# Patient Record
Sex: Female | Born: 1965 | Race: White | Hispanic: No | Marital: Single | Smoking: Never smoker
Health system: Southern US, Community
[De-identification: ages and names within clinical notes are randomized; demographics above are authoritative.]

## PROBLEM LIST (undated history)

## (undated) DIAGNOSIS — D649 Anemia, unspecified: Secondary | ICD-10-CM

## (undated) DIAGNOSIS — J45909 Unspecified asthma, uncomplicated: Secondary | ICD-10-CM

## (undated) DIAGNOSIS — M722 Plantar fascial fibromatosis: Secondary | ICD-10-CM

## (undated) DIAGNOSIS — G43909 Migraine, unspecified, not intractable, without status migrainosus: Secondary | ICD-10-CM

## (undated) DIAGNOSIS — M719 Bursopathy, unspecified: Secondary | ICD-10-CM

## (undated) DIAGNOSIS — M1990 Unspecified osteoarthritis, unspecified site: Secondary | ICD-10-CM

## (undated) DIAGNOSIS — K259 Gastric ulcer, unspecified as acute or chronic, without hemorrhage or perforation: Secondary | ICD-10-CM

## (undated) HISTORY — PX: TUBAL LIGATION: SHX77

## (undated) HISTORY — PX: CARPAL TUNNEL RELEASE: SHX101

---

## 2004-06-24 ENCOUNTER — Emergency Department: Payer: Self-pay | Admitting: Emergency Medicine

## 2004-07-02 ENCOUNTER — Emergency Department: Payer: Self-pay | Admitting: Internal Medicine

## 2004-07-27 ENCOUNTER — Ambulatory Visit: Payer: Self-pay | Admitting: Obstetrics and Gynecology

## 2006-07-22 ENCOUNTER — Ambulatory Visit: Payer: Self-pay

## 2009-01-06 ENCOUNTER — Ambulatory Visit: Payer: Self-pay | Admitting: Specialist

## 2009-01-09 ENCOUNTER — Ambulatory Visit: Payer: Self-pay | Admitting: Specialist

## 2011-04-01 ENCOUNTER — Ambulatory Visit: Payer: Self-pay | Admitting: Internal Medicine

## 2011-04-09 ENCOUNTER — Ambulatory Visit: Payer: Self-pay | Admitting: Internal Medicine

## 2011-04-12 ENCOUNTER — Ambulatory Visit: Payer: Self-pay | Admitting: Internal Medicine

## 2011-05-10 ENCOUNTER — Ambulatory Visit: Payer: Self-pay | Admitting: Internal Medicine

## 2011-07-08 ENCOUNTER — Ambulatory Visit: Payer: Self-pay | Admitting: Family Medicine

## 2011-07-12 ENCOUNTER — Inpatient Hospital Stay: Payer: Self-pay | Admitting: Internal Medicine

## 2011-07-12 LAB — URINALYSIS, COMPLETE
Blood: NEGATIVE
Leukocyte Esterase: NEGATIVE
Ph: 5 (ref 4.5–8.0)
Protein: NEGATIVE
Specific Gravity: 1.014 (ref 1.003–1.030)
Squamous Epithelial: 2
WBC UR: <1 /HPF (ref 0–5)

## 2011-07-12 LAB — COMPREHENSIVE METABOLIC PANEL
Albumin: 4 g/dL (ref 3.4–5.0)
Alkaline Phosphatase: 77 U/L (ref 50–136)
Chloride: 111 mmol/L — ABNORMAL HIGH (ref 98–107)
Co2: 22 mmol/L (ref 21–32)
Creatinine: 0.71 mg/dL (ref 0.60–1.30)
EGFR (Non-African Amer.): >60
Glucose: 97 mg/dL (ref 65–99)
Osmolality: 289 (ref 275–301)
Potassium: 4.4 mmol/L (ref 3.5–5.1)
SGOT(AST): 27 U/L (ref 15–37)
Sodium: 142 mmol/L (ref 136–145)
Total Protein: 7.1 g/dL (ref 6.4–8.2)

## 2011-07-12 LAB — CBC
HGB: 11.5 g/dL — ABNORMAL LOW (ref 12.0–16.0)
MCHC: 33.8 g/dL (ref 32.0–36.0)
Platelet: 164 10*3/uL (ref 150–440)
RDW: 14 % (ref 11.5–14.5)
WBC: 6.2 10*3/uL (ref 3.6–11.0)

## 2011-07-12 LAB — PROTIME-INR: INR: 1

## 2011-07-12 LAB — HEMOGLOBIN: HGB: 9.6 g/dL — ABNORMAL LOW (ref 12.0–16.0)

## 2011-07-13 LAB — CBC WITH DIFFERENTIAL/PLATELET
Basophil #: 0 10*3/uL (ref 0.0–0.1)
Basophil %: 0.3 %
Eosinophil #: 0.1 10*3/uL (ref 0.0–0.7)
Eosinophil %: 1.6 %
HCT: 26.1 % — ABNORMAL LOW (ref 35.0–47.0)
HGB: 8.8 g/dL — ABNORMAL LOW (ref 12.0–16.0)
Lymphocyte #: 1.5 10*3/uL (ref 1.0–3.6)
MCH: 32.6 pg (ref 26.0–34.0)
MCHC: 33.8 g/dL (ref 32.0–36.0)
Neutrophil %: 60.2 %
WBC: 4.9 10*3/uL (ref 3.6–11.0)

## 2011-07-14 LAB — BASIC METABOLIC PANEL
Anion Gap: 8 (ref 7–16)
BUN: 16 mg/dL (ref 7–18)
Calcium, Total: 7.7 mg/dL — ABNORMAL LOW (ref 8.5–10.1)
Co2: 23 mmol/L (ref 21–32)
EGFR (African American): >60
Osmolality: 289 (ref 275–301)
Sodium: 145 mmol/L (ref 136–145)

## 2011-07-14 LAB — PROTIME-INR
INR: 1.1
Prothrombin Time: 14.5 secs (ref 11.5–14.7)

## 2011-07-14 LAB — CBC WITH DIFFERENTIAL/PLATELET
Basophil #: 0 10*3/uL (ref 0.0–0.1)
Basophil %: 0.2 %
Eosinophil #: 0 10*3/uL (ref 0.0–0.7)
Eosinophil %: 0.5 %
HCT: 24.3 % — ABNORMAL LOW (ref 35.0–47.0)
HGB: 8.3 g/dL — ABNORMAL LOW (ref 12.0–16.0)
Lymphocyte %: 23.7 %
MCV: 94 fL (ref 80–100)
Monocyte #: 0.4 x10 3/mm (ref 0.2–0.9)
Monocyte %: 6.5 %
Platelet: 95 10*3/uL — ABNORMAL LOW (ref 150–440)
RDW: 14.6 % — ABNORMAL HIGH (ref 11.5–14.5)
WBC: 6.7 10*3/uL (ref 3.6–11.0)

## 2011-07-14 LAB — HEMOGLOBIN: HGB: 8.4 g/dL — ABNORMAL LOW (ref 12.0–16.0)

## 2011-07-15 LAB — HEMOGLOBIN
HGB: 7.5 g/dL — ABNORMAL LOW (ref 12.0–16.0)
HGB: 8 g/dL — ABNORMAL LOW (ref 12.0–16.0)
HGB: 8.5 g/dL — ABNORMAL LOW (ref 12.0–16.0)
HGB: 8.9 g/dL — ABNORMAL LOW (ref 12.0–16.0)

## 2011-07-16 LAB — HEMOGLOBIN: HGB: 8.8 g/dL — ABNORMAL LOW (ref 12.0–16.0)

## 2011-07-17 LAB — CBC WITH DIFFERENTIAL/PLATELET
Basophil #: 0 10*3/uL (ref 0.0–0.1)
Basophil %: 0.3 %
Eosinophil %: 1.9 %
Lymphocyte #: 1.3 10*3/uL (ref 1.0–3.6)
MCH: 32.2 pg (ref 26.0–34.0)
MCV: 93 fL (ref 80–100)

## 2011-08-23 ENCOUNTER — Ambulatory Visit: Payer: Self-pay | Admitting: Unknown Physician Specialty

## 2012-01-30 ENCOUNTER — Emergency Department: Payer: Self-pay | Admitting: Emergency Medicine

## 2012-01-30 LAB — URINALYSIS, COMPLETE
Bacteria: NONE SEEN
Blood: NEGATIVE
Glucose,UR: NEGATIVE mg/dL (ref 0–75)
Ketone: NEGATIVE
Nitrite: NEGATIVE
Specific Gravity: 1.016 (ref 1.003–1.030)
Squamous Epithelial: 6
WBC UR: 4 /HPF (ref 0–5)

## 2012-01-30 LAB — COMPREHENSIVE METABOLIC PANEL
Albumin: 3.5 g/dL (ref 3.4–5.0)
Alkaline Phosphatase: 102 U/L (ref 50–136)
Anion Gap: 10 (ref 7–16)
BUN: 12 mg/dL (ref 7–18)
Bilirubin,Total: 0.6 mg/dL (ref 0.2–1.0)
Chloride: 107 mmol/L (ref 98–107)
Creatinine: 0.86 mg/dL (ref 0.60–1.30)
Glucose: 103 mg/dL — ABNORMAL HIGH (ref 65–99)
Osmolality: 272 (ref 275–301)
Potassium: 3.5 mmol/L (ref 3.5–5.1)
Sodium: 136 mmol/L (ref 136–145)
Total Protein: 7.3 g/dL (ref 6.4–8.2)

## 2012-01-30 LAB — CBC
HCT: 32 % — ABNORMAL LOW (ref 35.0–47.0)
HGB: 10.8 g/dL — ABNORMAL LOW (ref 12.0–16.0)
MCHC: 33.7 g/dL (ref 32.0–36.0)
MCV: 96 fL (ref 80–100)
RDW: 13.8 % (ref 11.5–14.5)
WBC: 10.3 10*3/uL (ref 3.6–11.0)

## 2012-01-30 LAB — RAPID INFLUENZA A&B ANTIGENS

## 2012-10-18 ENCOUNTER — Inpatient Hospital Stay: Payer: Self-pay | Admitting: Internal Medicine

## 2012-10-18 LAB — URINALYSIS, COMPLETE
Bilirubin,UR: NEGATIVE
Ketone: NEGATIVE
Leukocyte Esterase: NEGATIVE
Nitrite: NEGATIVE
RBC,UR: 1 /HPF (ref 0–5)
Squamous Epithelial: 7
WBC UR: <1 /HPF (ref 0–5)

## 2012-10-18 LAB — CBC
HCT: 35.2 % (ref 35.0–47.0)
HGB: 12.4 g/dL (ref 12.0–16.0)
MCH: 31.9 pg (ref 26.0–34.0)
MCHC: 35.2 g/dL (ref 32.0–36.0)
MCV: 91 fL (ref 80–100)
RBC: 3.89 10*6/uL (ref 3.80–5.20)
RDW: 14.2 % (ref 11.5–14.5)

## 2012-10-18 LAB — COMPREHENSIVE METABOLIC PANEL
Alkaline Phosphatase: 132 U/L (ref 50–136)
Anion Gap: 4 — ABNORMAL LOW (ref 7–16)
BUN: 11 mg/dL (ref 7–18)
Bilirubin,Total: 0.7 mg/dL (ref 0.2–1.0)
Calcium, Total: 9 mg/dL (ref 8.5–10.1)
Chloride: 107 mmol/L (ref 98–107)
Co2: 27 mmol/L (ref 21–32)
EGFR (African American): >60
EGFR (Non-African Amer.): >60
Osmolality: 275 (ref 275–301)
Potassium: 3.8 mmol/L (ref 3.5–5.1)
SGOT(AST): 34 U/L (ref 15–37)
SGPT (ALT): 38 U/L (ref 12–78)
Sodium: 138 mmol/L (ref 136–145)

## 2012-10-18 LAB — HEMOGLOBIN
HGB: 12.3 g/dL (ref 12.0–16.0)
HGB: 11.3 g/dL — ABNORMAL LOW (ref 12.0–16.0)

## 2012-10-18 LAB — PROTIME-INR: INR: 0.9

## 2012-10-19 LAB — CBC WITH DIFFERENTIAL/PLATELET
Basophil %: 0.4 %
HCT: 31.6 % — ABNORMAL LOW (ref 35.0–47.0)
HGB: 10.9 g/dL — ABNORMAL LOW (ref 12.0–16.0)
Lymphocyte #: 1.2 10*3/uL (ref 1.0–3.6)
Lymphocyte %: 32.4 %
Monocyte #: 0.5 x10 3/mm (ref 0.2–0.9)
Monocyte %: 13.8 %
Neutrophil %: 50.4 %
Platelet: 162 10*3/uL (ref 150–440)
RBC: 3.45 10*6/uL — ABNORMAL LOW (ref 3.80–5.20)
RDW: 14.4 % (ref 11.5–14.5)
WBC: 3.7 10*3/uL (ref 3.6–11.0)

## 2012-10-19 LAB — COMPREHENSIVE METABOLIC PANEL
Albumin: 3 g/dL — ABNORMAL LOW (ref 3.4–5.0)
Alkaline Phosphatase: 120 U/L (ref 50–136)
Anion Gap: 7 (ref 7–16)
Bilirubin,Total: 0.7 mg/dL (ref 0.2–1.0)
Chloride: 108 mmol/L — ABNORMAL HIGH (ref 98–107)
Glucose: 78 mg/dL (ref 65–99)
Osmolality: 279 (ref 275–301)
Potassium: 3.4 mmol/L — ABNORMAL LOW (ref 3.5–5.1)
SGPT (ALT): 32 U/L (ref 12–78)
Sodium: 141 mmol/L (ref 136–145)

## 2013-06-09 ENCOUNTER — Ambulatory Visit: Payer: Self-pay | Admitting: Orthopedic Surgery

## 2013-08-27 ENCOUNTER — Emergency Department: Payer: Self-pay | Admitting: Emergency Medicine

## 2014-04-16 ENCOUNTER — Emergency Department: Payer: Self-pay | Admitting: Emergency Medicine

## 2014-05-19 ENCOUNTER — Emergency Department: Payer: Self-pay | Admitting: Emergency Medicine

## 2014-07-01 NOTE — Consult Note (Signed)
CC: melena, falling hgb over night.  Due to this and previous duodenal ulcers she needs EGD today.  Dr,. Rein to do for me.  Patient aware of change in physicians.  Electronic Signatures: Scot JunElliott, Robert T (MD)  (Signed on 11-Aug-14 10:00)  Authored  Last Updated: 11-Aug-14 10:00 by Scot JunElliott, Robert T (MD)

## 2014-07-01 NOTE — Consult Note (Signed)
PATIENT NAMEEMMALINE, [name] MR#:  [id number] DATE OF BIRTH:  07/13/1965  DATE OF CONSULTATION:  10/18/2012  REFERRING PHYSICIAN:   CONSULTING PHYSICIAN:  Manya Silvas, MD  HISTORY OF PRESENT ILLNESS:  The patient is a 49 year old white female who was mowing her lawn and tripped and hurt her ankle. She took some ibuprofen on Thursday. She had melena on Saturday and came to the hospital today and was admitted to the hospital for melena and heme-positive stool. I was asked to see her in consultation.   The patient was in the hospital 07/12/2011 to 07/17/2011 last year with upper GI bleeding secondary to duodenal ulcer due to nonsteroidal anti-inflammatory drug use. She has chronic tension migraine headaches and is on Topamax and Imitrex for this.   ALLERGIES: AUGMENTIN, LODINE AND TRAMADOL.   MEDICATIONS: Imitrex injection p.r.n., Advair Diskus 1 puff once a day and was taking ibuprofen 800 mg 3 times a day.   SOCIAL HISTORY: The patient does not smoke, does not drink. Works as a Freight forwarder at [employer].   REVIEW OF SYSTEMS: She denies any vomiting. No fever. No dysuria. No hematuria. She does have frequent headaches. She has felt weak and nauseated and also took some Pepto-Bismol, which may have contributed to the darkness of her stools.   PHYSICAL EXAMINATION GENERAL: White female in no acute distress.  HEENT: Sclerae anicteric. Conjunctivae negative. Tongue negative.  HEAD: Atraumatic. Trachea is in the midline.  CHEST: Clear.  HEART: No murmurs, gallops, clicks or rubs that I can hear.  ABDOMEN: Abdomen shows minimal tenderness in epigastric, right upper quadrant areas. No hepatosplenomegaly.  PSYCHIATRIC: Mood and affect are appropriate. The patient is alert and oriented.   LABORATORY DATA: Glucose 93, BUN 11, creatinine 0.77, sodium 138, potassium 3.8, chloride 107, CO2 27, calcium 9, total protein 7.5, albumin 3.8, total bili 0.7, alk phos 132, SGOT 34, SGPT 38. WBC is  5.2, hemoglobin 12.4, repeat is 12.3, platelet count 173.  O positive blood with negative antibody screen. Pro time 12.5, INR 0.9. Hazy urine, 2+ blood.   ASSESSMENT: Upper gastrointestinal bleed from a flare-up of ulcer disease with ibuprofen because of musculoskeletal injury.   RECOMMENDATIONS:  I agree with treating with IV Protonix. Her hemoglobin appears to be stable, do not see an indication for transfusion and given her previous endoscopy showing duodenal ulcers I think we can go and treat her. I would recommend getting an H. pylori blood test in the morning with  the next blood draw.   ____________________________ Manya Silvas, MD rte:cs D: 10/18/2012 17:06:00 ET T: 10/18/2012 17:28:22 ET JOB#: [id number]  cc: Leonie Douglas. Doy Hutching, MD Manya Silvas, MD, <Dictator> Milinda Pointer. Jacqualine Code, MD    Manya Silvas MD ELECTRONICALLY SIGNED 11/08/2012 16:22

## 2014-07-01 NOTE — Consult Note (Signed)
CC: PUD, on EGD no active bleeding of ulcer or duodenitis.  Await H. pylori blood test.  She could go home on bid PPI, full liquid type diet.  Recommend vicodin for pain since cannot take NSAID and allergic to tramadol.  Follow up with Dr. Shelle Ironein in a week or two.    Electronic Signatures: Scot JunElliott, Robert T (MD)  (Signed on 12-Aug-14 07:30)  Authored  Last Updated: 12-Aug-14 07:30 by Scot JunElliott, Robert T (MD)

## 2014-07-01 NOTE — H&P (Signed)
PATIENT NAMESHELITA, [name] MR#:  [id number] DATE OF BIRTH:  23-Jun-1965  DATE OF ADMISSION:  10/18/2012  REFERRING PHYSICIAN:  Dr. Mindi Junker  FAMILY PHYSICIAN:  None.   REASON FOR ADMISSION:  Upper GI bleed.   HISTORY OF PRESENT ILLNESS: The patient is a 49 year old female with a history of duodenal ulcer, treated here approximately 15 months ago. Also has a history of asthma and migraines. Has no medical insurance, so has not seen a doctor in the recent past. She has been using ibuprofen for an ankle strain over the past several days. Yesterday, the patient developed melanotic stools associated with abdominal pain and nausea. No vomiting. Presented to the Emergency Room, where she was found to be hemodynamically stable, with a normal hemoglobin. She did, however, have black stools which were guaiac-positive, and she is now admitted for further evaluation.   PAST MEDICAL HISTORY: 1.  History of duodenal ulcer with upper GI bleed.  2.  Asthma.  3.  Migraine headaches.  4.  History of hemorrhoids.   MEDICATIONS:  None.   ALLERGIES:  (Dictation Anomaly)  TRAMADOL AND AUGMENTIN.   SOCIAL HISTORY:  Negative for alcohol or tobacco abuse.   FAMILY HISTORY: Positive for hypertension and coronary artery disease, but otherwise unremarkable.   REVIEW OF SYSTEMS:   CONSTITUTIONAL:  No fever or change in weight.  EYES:  No blurred or double vision. No glaucoma.  EARS, NOSE THROAT: No tinnitus or hearing loss. No nasal discharge or bleeding. No difficulty swallowing.  RESPIRATORY:  No cough or wheezing. No hemoptysis.  CARDIOVASCULAR:  No chest pain or orthopnea. No palpitations or syncope.  GASTROINTESTINAL:  No vomiting. Some diarrhea.  GENITOURINARY:  No dysuria, hematuria, or incontinence.  ENDOCRINE:  No polyuria or polydipsia. No heat or cold intolerance.  HEMATOLOGIC:  The patient denies anemia, easy bruising.  LYMPHATIC:  No swollen glands.  MUSCULOSKELETAL:  The patient has some back  pain, but denies neck, shoulder, knee, or hip pain. Some left ankle pain. No gout.  NEUROLOGIC:  No numbness. Some weakness. No recent migraines. Denies stroke or seizures.  PSYCHIATRIC:  The patient denies anxiety, insomnia, or depression.   PHYSICAL EXAMINATION: GENERAL:  The patient is in no acute distress.  VITAL SIGNS: Remarkable for a blood pressure of 119/63 with a heart rate of 64 and a respiratory rate of 16. She is afebrile.  HEENT: Normocephalic, atraumatic. Pupils equally round and reactive to light and accommodation. Extraocular movements are intact. Sclerae are nonicteric. Conjunctivae are clear. Oropharynx is clear.  NECK: Supple, without JVD or bruits. No adenopathy or thyromegaly was noted.  LUNGS:  Clear to auscultation and percussion without wheezes, rales, or rhonchi. No dullness. Respiratory effort is normal.  CARDIAC EXAM:  Regular rate and rhythm. Normal S1, S2. No significant rubs, murmurs, or gallops. PMI is nondisplaced. Chest wall is nontender.  ABDOMEN:  Soft, but diffusely tender. No rebound or guarding. Normoactive bowel sounds. No organomegaly or masses were appreciated. No hernias or bruits were noted.  RECTAL EXAM:  Revealed guaiac positive stool, per the Emergency Room physician.  EXTREMITIES: Without clubbing, cyanosis, or edema. Pulses were 2+ bilaterally.  SKIN:  Warm and dry, without rash or lesions.  NEUROLOGIC EXAM:  Revealed cranial nerves II through XII grossly intact. Deep tendon reflexes were symmetric. Motor and sensory exams nonfocal.  PSYCHIATRIC EXAM:  Revealed a patient who is alert and oriented to person, place and time. She was cooperative and used good judgment.   LABORATORY DATA:  Glucose was 93, with a BUN of 11, creatinine 0.77, sodium 138, with a potassium of 3.8. GFR was greater than 60. White count was 5.2, with a hemoglobin of 12.4. Pro time was 12.5, with an INR of 0.9. Urinalysis was unremarkable.   ASSESSMENT: 1.  Abdominal pain.  2.   Nausea.  3.  Diarrhea.  4.  Gastrointestinal bleed, presumably upper.  5.  Migraine headaches.  6.  Asthma.   PLAN:  The patient will be admitted to the floor on a clear liquid diet with IV fluids and a Protonix drip. Will follow her hemoglobin q. 6 hours. Will obtain a GI consult for presumed endoscopy. Will continue her Advair and resume Topamax for her headaches. Follow up routine labs in the morning. Further treatment and evaluation will depend upon the patient's progress.   Total time spent on this patient was 50 minutes.     ____________________________ Duane LopeJeffrey D. Judithann SheenSparks, MD jds:mr D: 10/18/2012 12:59:30 ET T: 10/18/2012 18:41:43 ET JOB#: [id number]  cc: Duane LopeJeffrey D. Judithann SheenSparks, MD, <Dictator> Luceal Hollibaugh Rodena Medin Apryle Stowell MD ELECTRONICALLY SIGNED 10/19/2012 7:52

## 2014-07-01 NOTE — Consult Note (Signed)
CC: melena and nausea.  Pt stable, hgb stable, will follow with you.  Since EGD done 15months ago will not repeat at this time unless bleeding kicks up.  Electronic Signatures: Scot JunElliott, Summar Mcglothlin T (MD)  (Signed on 10-Aug-14 17:01)  Authored  Last Updated: 10-Aug-14 17:01 by Scot JunElliott, Nyana Haren T (MD)

## 2014-07-01 NOTE — Discharge Summary (Signed)
PATIENT NAMRalph [name]:  Tortorelli, Danniella MR#:  [id number] DATE OF BIRTH:  1965/09/27  DATE OF ADMISSION:  10/18/2012 DATE OF DISCHARGE:  10/20/2012  DISCHARGE DIAGNOSES: 1.  Gastrointestinal bleed/acute post hemorrhagic anemia, likely due to bleeding from duodenal ulcer with underlying stress and increased intake of nonsteroidal anti-inflammatory due to left shoulder/ankle pain, now hemodynamically stable.  2.  Abdominal pain/nausea likely due to duodenal ulcer, now improving.   SECONDARY DIAGNOSES: 1.  History of asthma.  2.  Migraine headaches.  3.  Hemorrhoids.   CONSULTATIONS:  GI, Dr. Mechele CollinElliott.   PROCEDURES AND RADIOLOGY:  1.  EGD on 08/11 showed duodenitis  and ulcer the duodenum.  2.  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  3.  Major laboratory panel: Urinalysis on admission was negative.   HISTORY AND SHORT HOSPITAL COURSE:  The patient is a 49 year-old female with multiple medical problems who was admitted for GI bleed. Please seen Dr. Judithann SheenSparks dictated history and physical for further details. GI consultation was obtained with Dr. Mechele CollinElliott who recommended EGD which was performed by Dr. Dow AdolphMatthew Rein and showed 1 duodenal ulcer with clean base. Erythematous duodenopathy was also seen. The patient  was started on diet and tolerated fine. She is being discharged back to home in stable condition. She remained hemodynamically stable.   PHYSICAL EXAMINATION  VITAL SIGNS: On the date of discharge are as follows:  Temperature 98.6, heart rate 80 per minute, respirations 18 per minute,  blood pressure 114/77,  saturating  99% on room air.  CARDIOVASCULAR: S1, S2 normal. No murmurs, rubs or gallops.  LUNGS: Clear to auscultation bilaterally. No wheezing, rales, rhonchi or crepitation.  ABDOMEN: Soft, benign.  NEUROLOGIC:  Nonfocal examination.   All other physical examination remained at baseline.    DISCHARGE MEDICATIONS:  1.  Advair 250/50 one puff daily.  2.   Imitrex 0.5 mL  subcutaneous once daily as needed.  3.  Topamax 50 mg p.o. at bedtime.  4.  Protonix 40 mg p.o. b.i.d.  5.  Sucralfate 1 gram p.o. 3 times a day before meals. 6.  Vicodin 300/5 mg 1 tablet p.o. b.i.d. as needed.   DISCHARGE DIET: Low sodium. Eat light for the first meal. Begin with soft diet.   DISCHARGE ACTIVITY: As tolerated.   DISCHARGE INSTRUCTIONS AND FOLLOW-UP: The patient was instructed to follow-up with GI, Dr. Dow AdolphMatthew Rein, in 1 to 2 weeks. She will need follow-up with Dr. Wonda ChengJoel Moffett, in 2 to 4 weeks.   TOTAL TIME DISCHARGING THIS PATIENT: 35 minutes.     ____________________________ Ellamae SiaVipul S. Sherryll BurgerShah, MD vss:dp D: 10/20/2012 14:24:31 ET T: 10/20/2012 16:35:26 ET JOB#: [id number]  cc: Heba Ige S. Sherryll BurgerShah, MD, <Dictator> Scot Junobert T. Elliott, MD Durward MallardJoel B. Marguerite OleaMoffett, MD Dow AdolphMatthew Rein, MD  Ellamae SiaVIPUL S [facility] MD ELECTRONICALLY SIGNED 10/21/2012 22:34

## 2014-07-03 NOTE — Consult Note (Signed)
Brief Consult Note: Diagnosis: UGI from duodenal ulcer.   Patient was seen by consultant.   Consult note dictated.   Recommend further assessment or treatment.   Discussed with Attending MD.   Comments: At present if rebleeds due to difficult location of ulcer in D2 will need laparotomy and oversew of ulcer. agree with move to ICU, serial hct. We are available in hospital.  Electronic Signatures: Natale LayBird, Talani Brazee (MD)  (Signed [phone] [phone]-May-13 15:12)  Authored: Brief Consult Note   Last Updated: 04-May-13 15:12 by Natale LayBird, Rheya Minogue (MD)

## 2014-07-03 NOTE — Consult Note (Signed)
Actively oozing ulcer in beginning of second portion of the duodenum treated with epi 10cc and heater probe.  Initially increased bleeding but then after all treatments the bleeding was noted to stop.  Will move to CCU, crossmatch and transfuse and get surgical consult.  Dr. Egbert GaribaldiBird notified.  Electronic Signatures: Scot JunElliott, Finn Amos T (MD)  (Signed on 04-May-13 15:00)  Authored  Last Updated: 04-May-13 15:00 by Scot JunElliott, Willis Holquin T (MD)

## 2014-07-03 NOTE — Consult Note (Signed)
PATIENT NAMRalph [name]:  [name], [name] MR#:  [id number] DATE OF BIRTH:  [birth year]  DATE OF CONSULTATION:  07/13/2011  REFERRING PHYSICIAN:  Lynnae Prudeobert Elliott, MD    CONSULTING PHYSICIAN:  Redge GainerMark A. Egbert GaribaldiBird, MD  REASON FOR CONSULTATION: Duodenal bleeding, upper GI bleed and anemia.   HISTORY: The patient is a 49 year old white female with a history of migraine headaches, and a remote history of upper gastrointestinal bleeding and duodenal ulcer 20 years ago, who uses nonsteroidal anti-inflammatory drugs. The patient was admitted yesterday to the Medical Service with mild anemia, new onset, and new onset of dark bleeding per rectum. She was seen by Gastroenterology. She was found to have a low hemoglobin on repeat, continued melena, and elevated BUN. An upper endoscopy was recently performed, at which point Dr. Mechele CollinElliott personally contacted me regarding further interventions. A punctate active area of ooze was found in the second portion of the duodenum and in a fold of the mucosa which was difficult to cauterize and inject with epinephrine, but the bleeding appeared to have been controlled with endoscopy. Due to the concern of the location and possible re-bleeding, Surgical consult was obtained. Of note, the patient denies any history of abdominal pain, nausea, vomiting, diarrhea, constipation, straining, but tarry stools, difficulty swallowing, loss of appetite. She does have a history of normocytic anemia and fluctuating leukopenia for which she was seeing Dr. Sherrlyn HockPandit in Hematology, and no specific etiology was identified.   ALLERGIES: Etodolac, Augmentin, and tramadol.   CURRENT MEDICATIONS: Advair, Aleve, ibuprofen, Imitrex,  topiramate, vitamin D3.   FAMILY HISTORY: Family history is significant for peptic ulcer disease in her father. Mother with Alzheimer disease, diabetes, lung cancer, breast cancer and leukemia.   PAST MEDICAL/SURGICAL HISTORY: Significant for: 1. Migraines.  2. Asthma.   3. Hemorrhoids. 4. Carpal tunnel surgery. 5. Tubal ligation.   SOCIAL HISTORY: Rare alcohol. Nonsmoker. She works at [employer] and [place] in [place].  PHYSICAL EXAMINATION:  GENERAL: The patient is alert and oriented.   VITAL SIGNS: Temperature is 98.5, pulse 86, respiratory rate 18, blood pressure 107/72. The patient has recently been sedated with upper endoscopy.   ABDOMEN: Soft and nontender. Appropriate scars. Abdomen is soft and nontender. No obvious masses or hernias.   LUNGS: Clear.   HEART: Regular rate and rhythm.   NEUROLOGIC/PSYCHIATRIC: Examination is normal.   LABORATORY, DIAGNOSTIC AND RADIOLOGICAL DATA:  Type and screen was obtained.  BUN 30, creatinine 0.71, sodium 142, chloride 111.  Liver function tests are normal.  Admission hemoglobin 11.5 at 18:30, at 5:30 was 9.6, at 6:00 this morning was 8.8. Platelet count 126,000.  Pro time is 13.1, INR is 1.0.  Plain films of the chest, PA and lateral, demonstrate no acute changes.   IMPRESSION: Upper gastrointestinal bleed and anemia secondary to a duodenal ulcer which has been controlled by Dr. Earnest ConroyElliott's endoscopy, heater probe and epinephrine injection.   PLAN: At present no surgical intervention is required; however, if she does rebleed, in speaking with Dr. Mechele CollinElliott and looking at the images and from his note, it is clear that this was a difficult control of bleeding, and she may possibly require laparotomy with duodenotomy or pyloroplasty oversew of bleeding. I will follow along with you closely. Surgical Services will be available 24/7 this weekend in the hospital.   ____________________________ Redge GainerMark A. Egbert GaribaldiBird, MD mab:cbb D: 07/13/2011 15:20:08 ET T: 07/13/2011 15:47:28 ET JOB#: [id number] cc: Scot Junobert T. Elliott, MD Durward MallardJoel B. Marguerite OleaMoffett, MD Loraine LericheMARK Kela MillinA Janiah Devinney MD ELECTRONICALLY SIGNED 07/14/2011 17:25

## 2014-07-03 NOTE — Consult Note (Signed)
VSS afebrile, hgb was down some so she was given a unit of blood today.  Her color looks much better than on Saturday.  No abd pain. no nausea or vomiting.   Abd not tender, chest clear. Will try full liquids tomorrow and maybe home Wednesday on bid PPI.   Electronic Signatures: Scot JunElliott, Emili Mcloughlin T (MD)  (Signed on 06-May-13 17:17)  Authored  Last Updated: 06-May-13 17:17 by Scot JunElliott, Tikesha Mort T (MD)

## 2014-07-03 NOTE — H&P (Signed)
[name]:  [name], [name] MR#:  [id number] DATE OF BIRTH:  04-06-1965  DATE OF ADMISSION:  07/12/2011  PRIMARY CARE PHYSICIAN:  Dr. Wonda ChengJoel Moffett.   CHIEF COMPLAINT: Bright red blood per rectum today.   HISTORY OF PRESENT ILLNESS: [name] is a pleasant [age] year old Caucasian female with history of asthma and migraine headaches who comes to [name] after she started having episodes of maroon to bright red blood per rectum. She had about three episodes today at work, felt a little dizzy, came to [name], had another episode of rectal bleed and is now being admitted for further evaluation and management. [name] denies any abdominal pain, fever, nausea or vomiting. In [name] Emergency Room she was started on IV fluids. She is hemodynamically stable. She is being admitted for further evaluation.   PAST MEDICAL HISTORY:  1. Migraine headaches.  2. Asthma.  3. Hemorrhoids.   ALLERGIES: Etodolac, Augmentin and tramadol.   CURRENT MEDICATIONS:  1. Advair 250/50, one puff b.i.d.  2. Aleve 220 mg 2 tablets as needed for arm pain.  3. Ibuprofen 200 mg 3 tablets as needed for arm pain.  4. Imitrex 100 mg 1 tablet daily as needed for migraine.  5. Imitrex injection as needed for migraine prophylaxis.  6. Topiramate 50 mg 3 tablets.  7. Vitamin D3 2000 international units p.o. daily.   FAMILY HISTORY: Positive for mother with Alzheimer's dementia and type 2 diabetes.  Father died of lung cancer. There is history of breast cancer and leukemia on [name] father's side.   SOCIAL HISTORY: Nonsmoker. Works at [employer]. Occasionally drinks alcohol.   REVIEW OF SYSTEMS: CONSTITUTIONAL: No fever, fatigue or weakness. EYES: No blurred or double vision. ENT: No tinnitus, ear pain, or hearing loss. RESPIRATORY: No cough, wheeze, or hemoptysis. CARDIOVASCULAR: No chest pain, orthopnea, or edema. GASTROINTESTINAL: Positive for rectal bleed. No nausea, vomiting, diarrhea, or abdominal pain. GU:  No dysuria or hematuria. ENDOCRINE: No polyuria or nocturia. HEMATOLOGY: No anemia or easy bruising. SKIN: No acne or rash. MUSCULOSKELETAL: Positive for left shoulder pain. NEUROLOGIC: No cerebrovascular accident or transient ischemic attacks. PSYCH: No anxiety or depression. All other systems reviewed and negative.   PHYSICAL EXAMINATION:  GENERAL: [name] is awake, alert, and oriented x3, not in acute distress.   VITAL SIGNS: Afebrile, pulse 97, blood pressure 130/85, sats 100% on room air.   HEENT: Atraumatic, normocephalic. Pupils are equal, round, and reactive to light and accommodation. Extraocular movements intact. Oral mucosa is moist.   NECK: Supple. No jugular venous distention.  No carotid bruit.   LUNGS: Clear to auscultation bilaterally. No rales, rhonchi, respiratory distress, or labored breathing.   HEART: Both heart sounds are normal. Rhythm is regular. Rate is normal. No murmur heard. PMI is not lateralized. Chest is nontender.   EXTREMITIES: Good pedal pulses, good femoral pulses. No lower extremity edema.   ABDOMEN: Soft, benign, and nontender. No organomegaly. Positive bowel sounds.   NEUROLOGIC: Grossly intact cranial nerves II through XII. No motor or sensory deficits.   PSYCH: [name] is awake, alert, and oriented x3.   SKIN: Warm and dry.   LABORATORY, DIAGNOSTIC, AND RADIOLOGICAL DATA: Hemoglobin and hematocrit is 11.5 and 33.9, white count 6.2, platelet count 164. PT-INR within normal limits. Urinalysis negative for urinary tract infection. Comprehensive metabolic panel within normal limits except chloride of 111 and BUN of 30. Chest x-ray: No acute cardiopulmonary abnormality.   ASSESSMENT AND PLAN: [age] year old [name] with:  1. Rectal  bleed, acute. [name] presented with dark maroon blood x4 episodes, likely diverticular versus hemorrhoidal. She is hemodynamically stable.  2. History of asthma.  3. History of migraine headaches.  4. Left  shoulder pain, appears due to partial tears of supraspinatus tendon and partial tears of [name] subscapularis tendons.   PLAN:  1. Admit [name] to medical floor.  2. FULL CODE.  3. N.p.o. except p.o. medications.  4. IV fluids for hydration.  5. P.r.n. Tylenol and Zofran.  6. Gastroenterology consultation with Dr. Mechele Collin. [name] case was discussed with Dr. Mechele Collin.  7. Will check hemoglobin and hematocrit eight to twelve hourly. Transfuse as needed.  8. We will get a bleeding scan as per recommendation by Dr. Mechele Collin.  9. Further work-up according to [name]'s clinical course. [name] hospital admission plan was discussed with [name] and family members. [name] plan was also discussed with Dr. Mechele Collin.   TIME SPENT: 50 minutes.   ____________________________ Wylie Hail Allena Katz, MD sap:ap D: 07/12/2011 13:50:12 ET T: 07/12/2011 14:15:26 ET JOB#: [id number]  cc: Jaythen Hamme A. Allena Katz, MD, <Dictator> Durward Mallard. Marguerite Olea, MD Willow Ora MD ELECTRONICALLY SIGNED 07/12/2011 15:04

## 2014-07-03 NOTE — Consult Note (Signed)
Chief Complaint:   Subjective/Chief Complaint Covering for Dr. Vira Agar. Small amount of old blood per rectum shortly after EGD but none since. 2 units of PRBC transfused. Hgb now 8.3.   VITAL SIGNS/ANCILLARY NOTES: **Vital Signs.:   05-May-13 08:00   Vital Signs Type Routine   Pulse Pulse 82   Respirations Respirations 13   Pulse Ox % Pulse Ox % 100   Pulse Ox Activity Level  At rest   Oxygen Delivery Room Air/ 21 %   Pulse Ox Heart Rate 82   Brief Assessment:   Cardiac Regular    Respiratory normal resp effort    Additional Physical Exam sl edema of right hand. IV on right appears to be working well.   Routine Chem:  05-May-13 04:00    Glucose, Serum 75   BUN 16   Creatinine (comp) 0.75   Sodium, Serum 145   Potassium, Serum 3.5   Chloride, Serum 114   CO2, Serum 23   Calcium (Total), Serum 7.7   Osmolality (calc) 289   eGFR (African American) >60   eGFR (Non-African American) >60   Anion Gap 8  Routine Hem:  05-May-13 04:00    WBC (CBC) 6.7   RBC (CBC) 2.58   Hemoglobin (CBC) 8.3   Hematocrit (CBC) 24.3   Platelet Count (CBC) 95   MCV 94   MCH 32.1   MCHC 34.1   RDW 14.6  Routine Coag:  05-May-13 04:00    Prothrombin 14.5   INR 1.1  Routine Hem:  05-May-13 04:00    Neutrophil % 69.1   Lymphocyte % 23.7   Monocyte % 6.5   Eosinophil % 0.5   Basophil % 0.2   Neutrophil # 4.6   Lymphocyte # 1.6   Monocyte # 0.4   Eosinophil # 0.0   Basophil # 0.0   Assessment/Plan:  Assessment/Plan:   Assessment UGI bleeding from duodenum. No active bleeding since EGD.    Plan Continue to moniter hgb. Can start clears. Can more to reg floor later today if remains stable. DR. Vira Agar will see patient tomorrow. Thanks   Electronic Signatures: Verdie Shire (MD)  (Signed [phone] 08:15)  Authored: Chief Complaint, VITAL SIGNS/ANCILLARY NOTES, Brief Assessment, Lab Results, Assessment/Plan   Last Updated: 05-May-13 08:15 by Verdie Shire (MD)

## 2014-07-03 NOTE — Consult Note (Signed)
Pt hgb 6.8, 2 units ordered.  I discussed need for blood transfusion with her and the possiblity she may need surgery.  Discussed risks and benefits of transfusion.  Electronic Signatures: Scot JunElliott, Robert T (MD)  (Signed on 04-May-13 17:32)  Authored  Last Updated: 04-May-13 17:32 by Scot JunElliott, Robert T (MD)

## 2014-07-03 NOTE — Discharge Summary (Signed)
PATIENT NAMRalph [name]:  Vessey, Kaedence MR#:  [id number] DATE OF BIRTH:  30-Sep-1965  DATE OF ADMISSION:  07/12/2011 DATE OF DISCHARGE:  07/17/2011  DISCHARGE DIAGNOSES:  1. Acute blood loss anemia. 2. Upper gastrointestinal bleed secondary to duodenal ulcer, recent nonsteroidal antiinflammatory drug use. 3. Migraine headaches. 4. Gastroesophageal reflux disease. 5. Asthma.   DISPOSITION: The patient is being discharged home.   FOLLOW-UP:  1. Follow-up with Dr. Mechele CollinElliott in 1 to 2 weeks after discharge.  2. Follow-up with Dr. Marguerite OleaMoffett in 1 to 2 weeks after discharge.   DIET: Full liquid diet for three days and then soft diet until seen by Dr. Mechele CollinElliott. A dietary consultation was obtained for the patient and the patient was educated by the dietitian regarding full liquid diet and soft diet.   DISCHARGE MEDICATIONS: 1. Omeprazole 40 mg b.i.d.  2. Ventolin HFA 2 puffs q.i.d. as needed. 3. Imitrex 100 mg 1 tablet as needed, alternate with injection Imitrex 6 mg/0.5 mL subcutaneously as needed, alternate with Imitrex tablets.  4. Topamax 50 mg 3 tablets once a day as needed for migraine. 5. Vitamin D3 2,000 international units daily.  6. Advair 250/50, one puff daily.   LABORATORY, DIAGNOSTIC AND RADIOLOGICAL DATA: Chest x-ray showed no acute changes. Bleeding scan normal study. Complete metabolic panel essentially normal. Hemoglobin 11.5 on admission, fell as low as 6.8, 8.6 by the time of discharge.   CONSULTATION: Gastroenterology consultation with Dr. Mechele CollinElliott.   PROCEDURE: Endoscopy which showed a duodenal ulcer.   HOSPITAL COURSE: The patient is a 49 year old female with past medical history of migraine headaches, gastroesophageal reflux disease and asthma who presented with gastrointestinal bleed. She was also found to have acute hemorrhagic anemia for which she was transfused three units of blood. The patient had recently been taking a lot of Aleve and ibuprofen for shoulder pain. She was started on  PPI and clear liquid diet while in the hospital. She was tolerating a full liquid diet by the time of discharge. She was evaluated by Dr. Mechele CollinElliott who did an endoscopy and a duodenal ulcer was found. The patient has been advised to take a full liquid diet for three days and soft diet thereafter until she is seen by Dr. Mechele CollinElliott. She is being discharged home on oral PPI. All her questions were answered. She is being discharged home in a stable condition.   TIME SPENT: 45 minutes.    ____________________________ Darrick MeigsSangeeta Ka Bench, MD sp:ap D: 07/17/2011 16:17:30 ET T: 07/18/2011 11:41:38 ET JOB#: [id number]  cc: Darrick MeigsSangeeta Solmon Bohr, MD, <Dictator> Durward MallardJoel B. Marguerite OleaMoffett, MD Darrick MeigsSANGEETA Aaidyn San MD ELECTRONICALLY SIGNED 07/18/2011 14:15

## 2014-07-03 NOTE — Consult Note (Signed)
PATIENT NAMRalph [name]:  [name], [name] MR#:  [id number] DATE OF BIRTH:  12-Aug-1965  DATE OF CONSULTATION:  07/12/2011  REFERRING PHYSICIAN:   CONSULTING PHYSICIAN:  Keturah Barrehristiane H. Isamar Nazir, NP  PRIMARY CARE PHYSICIAN: Wonda ChengJoel Moffett, MD  HISTORY OF PRESENT ILLNESS: [name] is a pleasant 49 year old Caucasian woman admitted today with rectal bleeding. Gastroenterology has been consulted at the request of Dr. Allena KatzPatel to evaluate this bleeding. She has a history of asthma, migraines, hemorrhoids, carpal tunnel, tubal ligation, and a left rotator cuff injury for which she has been taking p.r.n. ibuprofen and Aleve for. She has just finished a GI bleeding scan. The results were negative. Reports sudden onset of painless dark red rectal bleeding today while at work. She states she did feel a little peaked last night but was better after dinner. Today she felt the urge to defecate, went to the toilet and filled the toilet bowl with dark red blood, three times at work. States she could not see through the blood, it was opaque. She then began to feel weak and subsequently went to the emergency department. Reports one episode of further rectal bleeding while in x-ray here. Rectal examination by the ED physician was with gross blood and heme positive. She states this has never happened to her before. No history of colonoscopy or EGD. Only other complaint is some mild acid reflux. Does take ibuprofen and Aleve, although she states this is not on a regular basis and is for her left shoulder pain. She does give a remote history of a peptic ulcer diagnosed sometime in the 1980s. Denies abdominal pain, nausea, vomiting, diarrhea, constipation, straining, black tarry stools, problems swallowing, loss of appetite, or unintentional loss of weight. She additionally did have hematology work-up last year for normocytic anemia and fluctuating leukopenia but did not reveal an etiology.  Per Dr. Darryll CapersSandeep Pandit's note, her counts resolved in January  of this year and she was discharged from regular hematology visits.   ALLERGIES: Etodolac, Augmentin, tramadol.   CURRENT MEDICATIONS:  1. Advair 250/50 one puff twice a day. 2. Aleve 220 mg 2 tabs p.r.n. arm pain.  3. Ibuprofen 200 mg 3 tabs p.r.n. arm pain. 4. Imitrex 100 mg 1 tab p.o. p.r.n. migraine. 5. Imitrex injection as needed migraine. 6. Topiramate 50 mg 3 tablets p.o. daily.  7. Vitamin D3 2000 international units p.o. daily.   FAMILY HISTORY: Pertinent for peptic ulcer disease in father. Mother with Alzheimer's and diabetes. Father deceased due to lung cancer. Aunt with breast cancer. Another second degree relative with leukemia. No history of colorectal cancer, liver disease, or IBD.   SOCIAL HISTORY: Rare EtOH. Nonsmoker. Works at [employer].   PAST MEDICAL HISTORY:  1. Migraines.  2. Asthma.  3. Hemorrhoids. 4. Carpal tunnel surgery. 5. Tubal ligation in 2006.  6. No history of EGD or colonoscopy.   REVIEW OF SYSTEMS: Ten-point review as per the history and physical of which I agree.  GI: As noted.   LABS/STUDIES: Most recent lab work: Glucose 97, BUN 30, creatinine 0.71, sodium 142, potassium 4.4, chloride 111, CO2 22, GFR greater than 60, calcium 8.8, total protein 7.1, albumin 4.0, total bilirubin 0.6, alkaline phosphatase 77, AST 27, and ALT 22. WBC 6.2, hemoglobin 11.5, hematocrit 33.9, and platelets 164, normocytic. PT 13.1. INR 1.   Urinalysis - negative.   PHYSICAL EXAMINATION:   VITAL SIGNS: Most recent vital signs show blood pressure 130/85, pulse 97, respiratory rate 20, oxygen saturation 100%, and temperature 98.8.   GENERAL:  Well appearing Caucasian female in no acute distress.   HEENT: Normocephalic, atraumatic. Eyes symmetrical. No redness, drainage, or inflammation to the eyes or the nares. Oral mucous membranes are pink and moist.   NECK: Supple. No JVD, thyromegaly, or lymphadenopathy.   RESPIRATORY: Respirations eupneic. Lungs clear to  auscultation and percussion.   CARDIAC: S1 and S2. Regular rate and rhythm. No MRG. No edema.   ABDOMEN: Nondistended. Bowel sounds x4. Soft and nontender. No hepatosplenomegaly, masses, hernias, rebound tenderness, or other peritoneal signs.   RECTAL: Grossly bloody, heme positive per the ED physician report. I did not repeat this at this time.   GENITOURINARY: Deferred.   EXTREMITIES: Warm, dry, and pink. No clubbing, cyanosis, or edema. Strength 5/5.   SKIN: Warm, dry, and pink. No erythema, lesion, or rash.   NEUROLOGIC: Cranial nerves II through XII intact. Alert and oriented x3. No facial droop. Speech clear.   PSYCH: Logical thought, pleasant, cooperative.   IMPRESSION AND PLAN: Rectal bleeding, possibly diverticular; however, with GI bleeding scan having negative results, somewhat elevated BUN/creatinine ratio, and history of mild acid reflux and NSAID use, she may be having an upper GI bleed. We will plan for EGD in the morning and write for twice a day pantoprazole intravenously. Would transfuse p.r.n. Agree with repeating hemoglobins and serial fluids. Further recommendations to follow after EGD.   These services were provided by Vevelyn Pat, MSN, [facility] in collaboration with Dr. Lynnae Prude with whom  have discussed this patient.  ____________________________ Keturah Barre, NP chl:slb D: 07/12/2011 16:38:30 ET     T: 07/12/2011 17:04:53 ET        JOB#: [id number] cc: Keturah Barre, NP, <Dictator> [name] Maize Trent Gabler FNP ELECTRONICALLY SIGNED 07/13/2011 19:33

## 2014-07-03 NOTE — Consult Note (Signed)
Pt without complaints except she is hungry. No abd pain, no bleeding, no vomiting.  Hgb stable at 8.8. VSS   will try full liquid diet today without grits and if does well can go home tomorrow on full liquids and advance slowly to soft food and follow up in office a week or two after discharge.  Should go home on PPI bid and absolutely no NSAID in any form.  Electronic Signatures: Scot JunElliott, Vergene Marland T (MD)  (Signed on 07-May-13 07:51)  Authored  Last Updated: 07-May-13 07:51 by Scot JunElliott, Sherron Mapp T (MD)

## 2014-07-03 NOTE — Consult Note (Signed)
Patient took a picture of her bloody stool while at work (the cause for her to come to ER) and it was very dark.  Her BUN is elevated as to creatinine and her hgb is now 9.6.  She had an ulcer 20 years ago on an UGI test.  She claims to be under stress, takes infeq ibuprofen.  Plan to do EGD tomorrow.  Likely duodenal or gastric ulcer.  Electronic Signatures: Scot JunElliott, Robert T (MD)  (Signed on 03-May-13 19:49)  Authored  Last Updated: 03-May-13 19:49 by Scot JunElliott, Robert T (MD)

## 2014-07-03 NOTE — Consult Note (Signed)
Brief Consult Note: Diagnosis: Rectal bleeding.   Patient was seen by consultant.   Consult note dictated.   Comments: Appreciate consult for 49 y/o caucasian woman with history of left rotator cuff tear, migraines, and asthma for rectal bleeding. Patient has just finished GI bleeding scan, results pending. Patient reports sudden onset of painless dark red rectal bleeding today while at work. States that she did feel a little peaky last night, but was better after dinner- but today felt the urge to defecate, went to toilet and filled the toilet bowel with dark red blood x 3 at work, then began to feel weak and subsequently went to ED. Reports one episode of further rectal bleeding while in Xray here. Rectal exam by ED physician with gross blood/heme positive.  States this has never happened before. No history of colonoscopy or EGD. Only other complaint is some mild acid reflux: does take Ibuprofen, about once weekly for left shoulder pain. Denies abdominal pain, NVD, constipation, straining, black tarry stools, problems swallowing, loss of appetite, unintentional loss of weight.  Additionally did have hematology workup last year for normocytic anemia/fluctuating leukopenia that did not reveal etiology, counts resolved 1/13 and she was discharged from regular hemat. visits Impression: Rectal bleeding: likely diverticular.  Agree with repeating hemoglobins, fluids. Would transfuse prn and write for PPI due to acid reflux/NSAID therapy. Will await bleeding scan results, may require vascular consult v. colonoscopy. Additionally: GIB scan back and negative. Spoke with Dr Markham JordanElliot. In regards to acid reflux and NSAIDs, and elevated BUN;creatinine ratio, she may he having UGI bleed- will plan for EGD in am, and write for bid IV PPI.  Electronic Signatures: [name], [name] (NP)  (Signed [phone]-May-13 16:02)  Authored: Brief Consult Note   Last Updated: 03-May-13 16:02 by [name], [name] (NP)

## 2014-10-28 ENCOUNTER — Emergency Department
Admission: EM | Admit: 2014-10-28 | Discharge: 2014-10-28 | Disposition: A | Discharge status: Home / Self Care | Payer: Medicaid Other | Attending: Emergency Medicine | Admitting: Emergency Medicine

## 2014-10-28 DIAGNOSIS — Z79899 Other long term (current) drug therapy: Secondary | ICD-10-CM | POA: Diagnosis not present

## 2014-10-28 DIAGNOSIS — N898 Other specified noninflammatory disorders of vagina: Secondary | ICD-10-CM | POA: Diagnosis present

## 2014-10-28 DIAGNOSIS — Z7951 Long term (current) use of inhaled steroids: Secondary | ICD-10-CM | POA: Diagnosis not present

## 2014-10-28 DIAGNOSIS — M545 Low back pain, unspecified: Secondary | ICD-10-CM

## 2014-10-28 DIAGNOSIS — N760 Acute vaginitis: Secondary | ICD-10-CM | POA: Diagnosis not present

## 2014-10-28 DIAGNOSIS — B9689 Other specified bacterial agents as the cause of diseases classified elsewhere: Secondary | ICD-10-CM

## 2014-10-28 DIAGNOSIS — F419 Anxiety disorder, unspecified: Secondary | ICD-10-CM | POA: Diagnosis not present

## 2014-10-28 HISTORY — DX: Plantar fascial fibromatosis: M72.2

## 2014-10-28 HISTORY — DX: Migraine, unspecified, not intractable, without status migrainosus: G43.909

## 2014-10-28 HISTORY — DX: Unspecified asthma, uncomplicated: J45.909

## 2014-10-28 HISTORY — DX: Bursopathy, unspecified: M71.9

## 2014-10-28 HISTORY — DX: Gastric ulcer, unspecified as acute or chronic, without hemorrhage or perforation: K25.9

## 2014-10-28 HISTORY — DX: Unspecified osteoarthritis, unspecified site: M19.90

## 2014-10-28 HISTORY — DX: Anemia, unspecified: D64.9

## 2014-10-28 LAB — URINALYSIS COMPLETE WITH MICROSCOPIC (ARMC ONLY)
BACTERIA UA: NONE SEEN
Bilirubin Urine: NEGATIVE
Glucose, UA: NEGATIVE mg/dL
Hgb urine dipstick: NEGATIVE
KETONES UR: NEGATIVE mg/dL
LEUKOCYTES UA: NEGATIVE
NITRITE: NEGATIVE
PH: 7 (ref 5.0–8.0)
PROTEIN: NEGATIVE mg/dL
RBC / HPF: NONE SEEN RBC/hpf (ref 0–5)
SPECIFIC GRAVITY, URINE: 1.014 (ref 1.005–1.030)
WBC UA: NONE SEEN WBC/hpf (ref 0–5)

## 2014-10-28 LAB — COMPREHENSIVE METABOLIC PANEL
ALBUMIN: 4.6 g/dL (ref 3.5–5.0)
ALT: 17 U/L (ref 14–54)
ANION GAP: 5 (ref 5–15)
AST: 20 U/L (ref 15–41)
Alkaline Phosphatase: 108 U/L (ref 38–126)
BUN: 20 mg/dL (ref 6–20)
CHLORIDE: 111 mmol/L (ref 101–111)
CO2: 25 mmol/L (ref 22–32)
Calcium: 9.8 mg/dL (ref 8.9–10.3)
Creatinine, Ser: 0.96 mg/dL (ref 0.44–1.00)
GFR calc Af Amer: >60 mL/min (ref >60)
GFR calc non Af Amer: >60 mL/min (ref >60)
GLUCOSE: 98 mg/dL (ref 65–99)
POTASSIUM: 3.9 mmol/L (ref 3.5–5.1)
SODIUM: 141 mmol/L (ref 135–145)
Total Bilirubin: 0.3 mg/dL (ref 0.3–1.2)
Total Protein: 7.6 g/dL (ref 6.5–8.1)

## 2014-10-28 LAB — CBC
HEMATOCRIT: 35.2 % (ref 35.0–47.0)
HEMOGLOBIN: 11.7 g/dL — AB (ref 12.0–16.0)
MCH: 30.8 pg (ref 26.0–34.0)
MCHC: 33.2 g/dL (ref 32.0–36.0)
MCV: 92.9 fL (ref 80.0–100.0)
Platelets: 199 10*3/uL (ref 150–440)
RBC: 3.79 MIL/uL — ABNORMAL LOW (ref 3.80–5.20)
RDW: 14.3 % (ref 11.5–14.5)
WBC: 7.3 10*3/uL (ref 3.6–11.0)

## 2014-10-28 LAB — WET PREP, GENITAL
Clue Cells Wet Prep HPF POC: NONE SEEN
Trich, Wet Prep: NONE SEEN
WBC WET PREP: NONE SEEN
YEAST WET PREP: NONE SEEN

## 2014-10-28 LAB — LIPASE, BLOOD: LIPASE: 17 U/L — AB (ref 22–51)

## 2014-10-28 MED ORDER — HYDROCODONE-ACETAMINOPHEN 5-325 MG PO TABS: 1.0000 | ORAL_TABLET | ORAL | Status: DC | PRN

## 2014-10-28 MED ORDER — METRONIDAZOLE 500 MG PO TABS: 500.0000 mg | ORAL_TABLET | Freq: Two times a day (BID) | ORAL | Status: AC

## 2014-10-28 NOTE — ED Provider Notes (Signed)
[facility] Emergency Department Provider Note  ____________________________________________  Time seen: 4 PM  I have reviewed the triage vital signs and the nursing notes.   HISTORY  Chief Complaint Abdominal Pain and Back Pain    HPI [name] is a 49 y.o. female who complains of vaginal odor for 2 weeks and burning. She is concerned she use infection so she tried Monistat which did not help. She has not been sexually active in 2 years. She has had some vague pelvic cramping as well. She also notes that she's had back pain for approximately one month. She has no focal deficits. No difficulty urinating. No dysuria or frequency. No fevers no chills. No nausea no vomiting. She did have an episode of diarrhea today. Patient is most concerned about her vaginal odor    Past Medical History  Diagnosis Date  . Asthma   . Migraine   . Multiple gastric ulcers   . Anemia   . Arthritis   . Plantar fasciitis   . Bursitis     There are no active problems to display for this patient.   Past Surgical History  Procedure Laterality Date  . Tubal ligation    . Carpal tunnel release Right     Current Outpatient Rx  Name  Route  Sig  Dispense  Refill  . albuterol (PROVENTIL HFA;VENTOLIN HFA) 108 (90 BASE) MCG/ACT inhaler   Inhalation   Inhale 2 puffs into the lungs.         . fluticasone-salmeterol (ADVAIR HFA) 115-21 MCG/ACT inhaler   Inhalation   Inhale 2 puffs into the lungs 2 (two) times daily.         . SUMAtriptan (IMITREX) 100 MG tablet   Oral   Take 100 mg by mouth every 2 (two) hours as needed for migraine. May repeat in 2 hours if headache persists or recurs.         . SUMAtriptan (IMITREX) 6 MG/0.5ML SOLN injection   Subcutaneous   Inject 6 mg into the skin every 2 (two) hours as needed for migraine or headache. May repeat in 2 hours if headache persists or recurs.         . topiramate (TOPAMAX) 200 MG tablet   Oral   Take 200 mg  by mouth 2 (two) times daily.         Marland Kitchen HYDROcodone-acetaminophen (NORCO/VICODIN) 5-325 MG per tablet   Oral   Take 1 tablet by mouth every 4 (four) hours as needed for moderate pain.   20 tablet   0   . metroNIDAZOLE (FLAGYL) 500 MG tablet   Oral   Take 1 tablet (500 mg total) by mouth 2 (two) times daily after a meal.   14 tablet   0     Allergies Augmentin and Tramadol  No family history on file.  Social History Social History  Substance Use Topics  . Smoking status: Never Smoker   . Smokeless tobacco: Never Used  . Alcohol Use: No    Review of Systems  Constitutional: Negative for fever. Eyes: Negative for visual changes. ENT: Negative for sore throat Respiratory: Negative for shortness of breath. Gastrointestinal: Negative for abdominal pain, vomiting and diarrhea. Genitourinary: Negative for dysuria. Musculoskeletal: Positive for back pain Skin: Negative for rash. Neurological: Negative for headaches or focal weakness Psychiatric: Mild anxiety    ____________________________________________   PHYSICAL EXAM:  VITAL SIGNS: ED Triage Vitals  Enc Vitals Group     BP 10/28/14 1448  140/80 mmHg     Pulse Rate 10/28/14 1448 71     Resp 10/28/14 1448 18     Temp 10/28/14 1448 98.1 F (36.7 C)     Temp Source 10/28/14 1448 Oral     SpO2 10/28/14 1448 100 %     Weight 10/28/14 1448 220 lb (99.791 kg)     Height 10/28/14 1448  (1.651 m)     Head Cir --      Peak Flow --      Pain Score 10/28/14 1449 10     Pain Loc --      Pain Edu? --      Excl. in GC? --     Constitutional: Alert and oriented. Well appearing and in no distress. Eyes: Conjunctivae are normal.  ENT   Head: Normocephalic and atraumatic.   Mouth/Throat: Mucous membranes are moist. Cardiovascular: Normal rate, regular rhythm. Normal and symmetric distal pulses are present in all extremities. No murmurs, rubs, or gallops. Respiratory: Normal respiratory effort without  tachypnea nor retractions. Breath sounds are clear and equal bilaterally.  Gastrointestinal: Soft and non-tender in all quadrants. No mass. No distention. There is no CVA tenderness. Genitourinary: Thin clear discharge, no cervicitis, no CMT Musculoskeletal: Nontender with normal range of motion in all extremities. No lower extremity tenderness nor edema. No vertebral tenderness to palpation. No muscle spasms and tenderness of the back. Full range of motion Neurologic:  Normal speech and language. No gross focal neurologic deficits are appreciated. Skin:  Skin is warm, dry and intact. No rash noted. Psychiatric: Mood and affect are normal. Patient exhibits appropriate insight and judgment.  ____________________________________________    LABS (pertinent positives/negatives)  Labs Reviewed  LIPASE, BLOOD - Abnormal; Notable for the following:    Lipase 17 (*)    All other components within normal limits  CBC - Abnormal; Notable for the following:    RBC 3.79 (*)    Hemoglobin 11.7 (*)    All other components within normal limits  URINALYSIS COMPLETEWITH MICROSCOPIC ([facility] ONLY) - Abnormal; Notable for the following:    Color, Urine YELLOW (*)    APPearance CLOUDY (*)    Squamous Epithelial / LPF 0-5 (*)    All other components within normal limits  WET PREP, GENITAL  COMPREHENSIVE METABOLIC PANEL    ____________________________________________   EKG  None  ____________________________________________    RADIOLOGY I have personally reviewed any xrays that were ordered on this patient: None  ____________________________________________   PROCEDURES  Procedure(s) performed: none  Critical Care performed: None  ____________________________________________   INITIAL IMPRESSION / ASSESSMENT AND PLAN / ED COURSE  Pertinent labs & imaging results that were available during my care of the patient were reviewed by me and considered in my medical decision making (see  chart for details).  Patient well-appearing. Her abdominal exam is benign. Although her wet prep does not show any clue cells I'm strongly suspicious of bacterial vaginosis given strong odor and vague pelvic cramping. I will treat her as such and asked her to follow up with her PCP to reevaluate her in 2 days or to return to the emergency department if her pain worsens or she has any concerns  ____________________________________________   FINAL CLINICAL IMPRESSION(S) / ED DIAGNOSES  Final diagnoses:  Bacterial vaginosis  Bilateral low back pain without sciatica     Jene Every, MD 10/28/14 1942

## 2014-10-28 NOTE — ED Notes (Signed)
Pt c/o RLQ pain and lower back pain with vaginal odor for the past 2 weeks..states she started having diarrhea today but is concerned that may be related to taking probiotic pills today trying to see if will help with possible yeast infection.Marland Kitchen

## 2014-10-28 NOTE — Discharge Instructions (Signed)
Back Pain, Adult °Low back pain is very common. About 1 in 5 people have back pain. The cause of low back pain is rarely dangerous. The pain often gets better over time. About half of people with a sudden onset of back pain feel better in just 2 weeks. About 8 in 10 people feel better by 6 weeks.  °CAUSES °Some common causes of back pain include: °· Strain of the muscles or ligaments supporting the spine. °· Wear and tear (degeneration) of the spinal discs. °· Arthritis. °· Direct injury to the back. °DIAGNOSIS °Most of the time, the direct cause of low back pain is not known. However, back pain can be treated effectively even when the exact cause of the pain is unknown. Answering your caregiver's questions about your overall health and symptoms is one of the most accurate ways to make sure the cause of your pain is not dangerous. If your caregiver needs more information, he or she may order lab work or imaging tests (X-rays or MRIs). However, even if imaging tests show changes in your back, this usually does not require surgery. °HOME CARE INSTRUCTIONS °For many people, back pain returns. Since low back pain is rarely dangerous, it is often a condition that people can learn to manage on their own.  °· Remain active. It is stressful on the back to sit or stand in one place. Do not sit, drive, or stand in one place for more than 30 minutes at a time. Take short walks on level surfaces as soon as pain allows. Try to increase the length of time you walk each day. °· Do not stay in bed. Resting more than 1 or 2 days can delay your recovery. °· Do not avoid exercise or work. Your body is made to move. It is not dangerous to be active, even though your back may hurt. Your back will likely heal faster if you return to being active before your pain is gone. °· Pay attention to your body when you  bend and lift. Many people have less discomfort when lifting if they bend their knees, keep the load close to their bodies, and  avoid twisting. Often, the most comfortable positions are those that put less stress on your recovering back. °· Find a comfortable position to sleep. Use a firm mattress and lie on your side with your knees slightly bent. If you lie on your back, put a pillow under your knees. °· Only take over-the-counter or prescription medicines as directed by your caregiver. Over-the-counter medicines to reduce pain and inflammation are often the most helpful. Your caregiver may prescribe muscle relaxant drugs. These medicines help dull your pain so you can more quickly return to your normal activities and healthy exercise. °· Put ice on the injured area. °· Put ice in a plastic bag. °· Place a towel between your skin and the bag. °· Leave the ice on for 15-20 minutes, 03-04 times a day for the first 2 to 3 days. After that, ice and heat may be alternated to reduce pain and spasms. °· Ask your caregiver about trying back exercises and gentle massage. This may be of some benefit. °· Avoid feeling anxious or stressed. Stress increases muscle tension and can worsen back pain. It is important to recognize when you are anxious or stressed and learn ways to manage it. Exercise is a great option. °SEEK MEDICAL CARE IF: °· You have pain that is not relieved with rest or medicine. °· You have pain that does not improve in 1 week. °· You have new symptoms. °· You are generally not feeling well. °SEEK   IMMEDIATE MEDICAL CARE IF:  °· You have pain that radiates from your back into your legs. °· You develop new bowel or bladder control problems. °· You have unusual weakness or numbness in your arms or legs. °· You develop nausea or vomiting. °· You develop abdominal pain. °· You feel faint. °Document Released: 02/25/2005 Document Revised: 08/27/2011 Document Reviewed: 06/29/2013 °[employer]® Patient Information ©2015 [employer]. This information is not intended to replace advice given to you by your health care provider. Make sure you  discuss any questions you have with your health care provider. ° °Bacterial Vaginosis °Bacterial vaginosis is a vaginal infection that occurs when the normal balance of bacteria in the vagina is disrupted. It results from an overgrowth of certain bacteria. This is the most common vaginal infection in women of childbearing age. Treatment is important to prevent complications, especially in pregnant women, as it can cause a premature delivery. °CAUSES  °Bacterial vaginosis is caused by an increase in harmful bacteria that are normally present in smaller amounts in the vagina. Several different kinds of bacteria can cause bacterial vaginosis. However, the reason that the condition develops is not fully understood. °RISK FACTORS °Certain activities or behaviors can put you at an increased risk of developing bacterial vaginosis, including: °· Having a new sex partner or multiple sex partners. °· Douching. °· Using an intrauterine device (IUD) for contraception. °Women do not get bacterial vaginosis from toilet seats, bedding, swimming pools, or contact with objects around them. °SIGNS AND SYMPTOMS  °Some women with bacterial vaginosis have no signs or symptoms. Common symptoms include: °· Grey vaginal discharge. °· A fishlike odor with discharge, especially after sexual intercourse. °· Itching or burning of the vagina and vulva. °· Burning or pain with urination. °DIAGNOSIS  °Your health care provider will take a medical history and examine the vagina for signs of bacterial vaginosis. A sample of vaginal fluid may be taken. Your health care provider will look at this sample under a microscope to check for bacteria and abnormal cells. A vaginal pH test may also be done.  °TREATMENT  °Bacterial vaginosis may be treated with antibiotic medicines. These may be given in the form of a pill or a vaginal cream. A second round of antibiotics may be prescribed if the condition comes back after treatment.  °HOME CARE INSTRUCTIONS    °· Only take over-the-counter or prescription medicines as directed by your health care provider. °· If antibiotic medicine was prescribed, take it as directed. Make sure you finish it even if you start to feel better. °· Do not have sex until treatment is completed. °· Tell all sexual partners that you have a vaginal infection. They should see their health care provider and be treated if they have problems, such as a mild rash or itching. °· Practice safe sex by using condoms and only having one sex partner. °SEEK MEDICAL CARE IF:  °· Your symptoms are not improving after 3 days of treatment. °· You have increased discharge or pain. °· You have a fever. °MAKE SURE YOU:  °· Understand these instructions. °· Will watch your condition. °· Will get help right away if you are not doing well or get worse. °FOR MORE INFORMATION  °Centers for Disease Control and Prevention, Division of STD Prevention: www.cdc.gov/std °American Sexual Health Association (ASHA): www.ashastd.org  °Document Released: 02/25/2005 Document Revised: 12/16/2012 Document Reviewed: 10/07/2012 °[employer]® Patient Information ©2015 [employer]. This information is not intended to replace advice given to you by your health   care provider. Make sure you discuss any questions you have with your health care provider. ° °

## 2014-12-11 ENCOUNTER — Emergency Department (HOSPITAL_COMMUNITY): Payer: Self-pay

## 2014-12-11 ENCOUNTER — Emergency Department (HOSPITAL_COMMUNITY): Payer: Medicaid Other

## 2014-12-11 ENCOUNTER — Emergency Department (HOSPITAL_COMMUNITY)
Admission: EM | Admit: 2014-12-11 | Discharge: 2014-12-11 | Disposition: A | Discharge status: Home / Self Care | Payer: Self-pay | Attending: Emergency Medicine | Admitting: Emergency Medicine

## 2014-12-11 ENCOUNTER — Encounter (HOSPITAL_COMMUNITY): Payer: Self-pay | Admitting: Emergency Medicine

## 2014-12-11 DIAGNOSIS — Z8719 Personal history of other diseases of the digestive system: Secondary | ICD-10-CM | POA: Insufficient documentation

## 2014-12-11 DIAGNOSIS — J4531 Mild persistent asthma with (acute) exacerbation: Secondary | ICD-10-CM

## 2014-12-11 DIAGNOSIS — J45901 Unspecified asthma with (acute) exacerbation: Secondary | ICD-10-CM | POA: Insufficient documentation

## 2014-12-11 DIAGNOSIS — Z792 Long term (current) use of antibiotics: Secondary | ICD-10-CM | POA: Insufficient documentation

## 2014-12-11 DIAGNOSIS — Z79899 Other long term (current) drug therapy: Secondary | ICD-10-CM | POA: Insufficient documentation

## 2014-12-11 DIAGNOSIS — M546 Pain in thoracic spine: Secondary | ICD-10-CM | POA: Insufficient documentation

## 2014-12-11 DIAGNOSIS — G43909 Migraine, unspecified, not intractable, without status migrainosus: Secondary | ICD-10-CM | POA: Insufficient documentation

## 2014-12-11 DIAGNOSIS — Z7951 Long term (current) use of inhaled steroids: Secondary | ICD-10-CM | POA: Insufficient documentation

## 2014-12-11 DIAGNOSIS — Z862 Personal history of diseases of the blood and blood-forming organs and certain disorders involving the immune mechanism: Secondary | ICD-10-CM | POA: Insufficient documentation

## 2014-12-11 DIAGNOSIS — R109 Unspecified abdominal pain: Secondary | ICD-10-CM | POA: Insufficient documentation

## 2014-12-11 DIAGNOSIS — M1990 Unspecified osteoarthritis, unspecified site: Secondary | ICD-10-CM | POA: Insufficient documentation

## 2014-12-11 LAB — URINALYSIS, ROUTINE W REFLEX MICROSCOPIC
BILIRUBIN URINE: NEGATIVE
Glucose, UA: NEGATIVE mg/dL
KETONES UR: NEGATIVE mg/dL
Leukocytes, UA: NEGATIVE
NITRITE: NEGATIVE
PROTEIN: NEGATIVE mg/dL
Specific Gravity, Urine: 1.02 (ref 1.005–1.030)
UROBILINOGEN UA: 1 mg/dL (ref 0.0–1.0)
pH: 7 (ref 5.0–8.0)

## 2014-12-11 LAB — URINE MICROSCOPIC-ADD ON

## 2014-12-11 MED ORDER — PREDNISONE 20 MG PO TABS: 40.0000 mg | ORAL_TABLET | Freq: Every day | ORAL | Status: DC

## 2014-12-11 MED ORDER — HYDROCODONE-ACETAMINOPHEN 5-325 MG PO TABS: 2.0000 | ORAL_TABLET | ORAL | Status: AC | PRN

## 2014-12-11 MED ORDER — PREDNISONE 20 MG PO TABS
60.0000 mg | ORAL_TABLET | Freq: Once | ORAL | Status: AC
  Administered 2014-12-11: 60 mg via ORAL
  Filled 2014-12-11: qty 3

## 2014-12-11 MED ORDER — BUDESONIDE 180 MCG/ACT IN AEPB: 2.0000 | INHALATION_SPRAY | Freq: Two times a day (BID) | RESPIRATORY_TRACT | Status: AC

## 2014-12-11 MED ORDER — IPRATROPIUM BROMIDE 0.02 % IN SOLN
0.5000 mg | Freq: Once | RESPIRATORY_TRACT | Status: AC
  Administered 2014-12-11: 0.5 mg via RESPIRATORY_TRACT
  Filled 2014-12-11: qty 2.5

## 2014-12-11 MED ORDER — MELOXICAM 7.5 MG PO TABS: 15.0000 mg | ORAL_TABLET | Freq: Every day | ORAL | Status: DC

## 2014-12-11 MED ORDER — DIAZEPAM 5 MG PO TABS: 5.0000 mg | ORAL_TABLET | Freq: Two times a day (BID) | ORAL | Status: AC | PRN

## 2014-12-11 MED ORDER — ALBUTEROL SULFATE (2.5 MG/3ML) 0.083% IN NEBU
5.0000 mg | INHALATION_SOLUTION | Freq: Once | RESPIRATORY_TRACT | Status: AC
Start: 2014-12-11 — End: 2014-12-11
  Administered 2014-12-11: 5 mg via RESPIRATORY_TRACT
  Filled 2014-12-11: qty 6

## 2014-12-11 MED ORDER — OXYCODONE-ACETAMINOPHEN 5-325 MG PO TABS
2.0000 | ORAL_TABLET | Freq: Once | ORAL | Status: AC
Start: 2014-12-11 — End: 2014-12-11
  Administered 2014-12-11: 2 via ORAL
  Filled 2014-12-11: qty 2

## 2014-12-11 NOTE — ED Notes (Signed)
Patient transported to X-ray 

## 2014-12-11 NOTE — Discharge Instructions (Signed)
Asthma, Acute Bronchospasm °Acute bronchospasm caused by asthma is also referred to as an asthma attack. Bronchospasm means your air passages become narrowed. The narrowing is caused by inflammation and tightening of the muscles in the air tubes (bronchi) in your lungs. This can make it hard to breathe or cause you to wheeze and cough. °CAUSES °Possible triggers are: °· Animal dander from the skin, hair, or feathers of animals. °· Dust mites contained in house dust. °· Cockroaches. °· Pollen from trees or grass. °· Mold. °· Cigarette or tobacco smoke. °· Air pollutants such as dust, household cleaners, hair sprays, aerosol sprays, paint fumes, strong chemicals, or strong odors. °· Cold air or weather changes. Cold air may trigger inflammation. Winds increase molds and pollens in the air. °· Strong emotions such as crying or laughing hard. °· Stress. °· Certain medicines such as aspirin or beta-blockers. °· Sulfites in foods and drinks, such as dried fruits and wine. °· Infections or inflammatory conditions, such as a flu, cold, or inflammation of the nasal membranes (rhinitis). °· Gastroesophageal reflux disease (GERD). GERD is a condition where stomach acid backs up into your esophagus. °· Exercise or strenuous activity. °SIGNS AND SYMPTOMS  °· Wheezing. °· Excessive coughing, particularly at night. °· Chest tightness. °· Shortness of breath. °DIAGNOSIS  °Your health care provider will ask you about your medical history and perform a physical exam. A chest X-ray or blood testing may be performed to look for other causes of your symptoms or other conditions that may have triggered your asthma attack.  °TREATMENT  °Treatment is aimed at reducing inflammation and opening up the airways in your lungs.  Most asthma attacks are treated with inhaled medicines. These include quick relief or rescue medicines (such as bronchodilators) and controller medicines (such as inhaled corticosteroids). These medicines are sometimes  given through an inhaler or a nebulizer. Systemic steroid medicine taken by mouth or given through an IV tube also can be used to reduce the inflammation when an attack is moderate or severe. Antibiotic medicines are only used if a bacterial infection is present.  °HOME CARE INSTRUCTIONS  °· Rest. °· Drink plenty of liquids. This helps the mucus to remain thin and be easily coughed up. Only use caffeine in moderation and do not use alcohol until you have recovered from your illness. °· Do not smoke. Avoid being exposed to secondhand smoke. °· You play a critical role in keeping yourself in good health. Avoid exposure to things that cause you to wheeze or to have breathing problems. °· Keep your medicines up-to-date and available. Carefully follow your health care provider's treatment plan. °· Take your medicine exactly as prescribed. °· When pollen or pollution is bad, keep windows closed and use an air conditioner or go to places with air conditioning. °· Asthma requires careful medical care. See your health care provider for a follow-up as advised. If you are more than [redacted] weeks pregnant and you were prescribed any new medicines, let your obstetrician know about the visit and how you are doing. Follow up with your health care provider as directed. °· After you have recovered from your asthma attack, make an appointment with your outpatient doctor to talk about ways to reduce the likelihood of future attacks. If you do not have a doctor who manages your asthma, make an appointment with a primary care doctor to discuss your asthma. °SEEK IMMEDIATE MEDICAL CARE IF:  °· You are getting worse. °· You have trouble breathing. If severe, call your local   emergency services (911 in the [place] °· You develop chest pain or discomfort. °· You are vomiting. °· You are not able to keep fluids down. °· You are coughing up yellow, green, brown, or bloody sputum. °· You have a fever and your symptoms suddenly get worse. °· You have  trouble swallowing. °MAKE SURE YOU:  °· Understand these instructions. °· Will watch your condition. °· Will get help right away if you are not doing well or get worse. °Document Released: 06/12/2006 Document Revised: 03/02/2013 Document Reviewed: 09/02/2012 °[employer]® Patient Information ©2015 [employer]. This information is not intended to replace advice given to you by your health care provider. Make sure you discuss any questions you have with your health care provider. °Back Pain, Adult °Low back pain is very common. About 1 in 5 people have back pain. The cause of low back pain is rarely dangerous. The pain often gets better over time. About half of people with a sudden onset of back pain feel better in just 2 weeks. About 8 in 10 people feel better by 6 weeks.  °CAUSES °Some common causes of back pain include: °· Strain of the muscles or ligaments supporting the spine. °· Wear and tear (degeneration) of the spinal discs. °· Arthritis. °· Direct injury to the back. °DIAGNOSIS °Most of the time, the direct cause of low back pain is not known. However, back pain can be treated effectively even when the exact cause of the pain is unknown. Answering your caregiver's questions about your overall health and symptoms is one of the most accurate ways to make sure the cause of your pain is not dangerous. If your caregiver needs more information, he or she may order lab work or imaging tests (X-rays or MRIs). However, even if imaging tests show changes in your back, this usually does not require surgery. °HOME CARE INSTRUCTIONS °For many people, back pain returns. Since low back pain is rarely dangerous, it is often a condition that people can learn to manage on their own.  °· Remain active. It is stressful on the back to sit or stand in one place. Do not sit, drive, or stand in one place for more than 30 minutes at a time. Take short walks on level surfaces as soon as pain allows. Try to increase the length of time  you walk each day. °· Do not stay in bed. Resting more than 1 or 2 days can delay your recovery. °· Do not avoid exercise or work. Your body is made to move. It is not dangerous to be active, even though your back may hurt. Your back will likely heal faster if you return to being active before your pain is gone. °· Pay attention to your body when you  bend and lift. Many people have less discomfort when lifting if they bend their knees, keep the load close to their bodies, and avoid twisting. Often, the most comfortable positions are those that put less stress on your recovering back. °· Find a comfortable position to sleep. Use a firm mattress and lie on your side with your knees slightly bent. If you lie on your back, put a pillow under your knees. °· Only take over-the-counter or prescription medicines as directed by your caregiver. Over-the-counter medicines to reduce pain and inflammation are often the most helpful. Your caregiver may prescribe muscle relaxant drugs. These medicines help dull your pain so you can more quickly return to your normal activities and healthy exercise. °· Put ice on the injured area. °¨ Put ice in a plastic bag. °¨ Place a towel between your skin   and the bag. °¨ Leave the ice on for 15-20 minutes, 03-04 times a day for the first 2 to 3 days. After that, ice and heat may be alternated to reduce pain and spasms. °· Ask your caregiver about trying back exercises and gentle massage. This may be of some benefit. °· Avoid feeling anxious or stressed. Stress increases muscle tension and can worsen back pain. It is important to recognize when you are anxious or stressed and learn ways to manage it. Exercise is a great option. °SEEK MEDICAL CARE IF: °· You have pain that is not relieved with rest or medicine. °· You have pain that does not improve in 1 week. °· You have new symptoms. °· You are generally not feeling well. °SEEK IMMEDIATE MEDICAL CARE IF:  °· You have pain that radiates from  your back into your legs. °· You develop new bowel or bladder control problems. °· You have unusual weakness or numbness in your arms or legs. °· You develop nausea or vomiting. °· You develop abdominal pain. °· You feel faint. °Document Released: 02/25/2005 Document Revised: 08/27/2011 Document Reviewed: 06/29/2013 °[employer]® Patient Information ©2015 [employer]. This information is not intended to replace advice given to you by your health care provider. Make sure you discuss any questions you have with your health care provider. ° °

## 2014-12-11 NOTE — ED Notes (Signed)
Declined W/C at D/C and was escorted to lobby by RN. 

## 2014-12-11 NOTE — ED Provider Notes (Signed)
CSN: [id number]     Arrival date & time 12/11/14  1458 History  By signing my name below, I, Lyndel Safe, attest that this documentation has been prepared under the direction and in the presence of  Danelle Berry, PA-C. Electronically Signed: Lyndel Safe, ED Scribe. 12/11/2014. 10:15 AM.   Chief Complaint  Patient presents with  . Back Pain   The history is provided by the patient. No language interpreter was used.   HPI Comments: [name] is a 49 y.o. 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 Pain is worse with coughing, sneezing, twisting, and while riding over bumps in a car. Pt denies the pain to be present with sitting stationary but reports a tight pain with movement and intermittent squeezing quality.  She has a previous back injury and believes her back pain may have been related to that with recent increasing cough.  She has a pmhx of asthma that has worsened over the past 3 weeks with a non-productive cough that is mildly alleviated with increased use of rescue inhaler, but she has had to use it several times per day. She complains of vague abdominal pain that occurred several months ago.  She denies N, V, D, constipation, dysuria, hematuria, vaginal symptoms.   No PShx to abdomen. Denies any other arthralgias, CP, SOB, fevers or chills, abdominal pain, dysuria, or hematuria. Also denies recent URI symptoms.    Past Medical History  Diagnosis Date  . Asthma   . Migraine   . Multiple gastric ulcers   . Anemia   . Arthritis   . Plantar fasciitis   . Bursitis    Past Surgical History  Procedure Laterality Date  . Tubal ligation    . Carpal tunnel release Right    History reviewed. No pertinent family history. Social History  Substance Use  Topics  . Smoking status: Never Smoker   . Smokeless tobacco: Never Used  . Alcohol Use: No   OB History    No data available     Review of Systems  Constitutional: Negative for fever and chills.  Respiratory: Positive for cough. Negative for shortness of breath.   Cardiovascular: Negative for chest pain.  Gastrointestinal: Negative for abdominal pain.  Genitourinary: Negative for dysuria and hematuria.  Musculoskeletal: Positive for back pain ( right mid ).   Allergies  Aspirin; Augmentin; and Tramadol  Home Medications   Prior to Admission medications   Medication Sig Start Date End Date Taking? Authorizing Provider  albuterol (PROVENTIL HFA;VENTOLIN HFA) 108 (90 BASE) MCG/ACT inhaler Inhale 2 puffs into the lungs.    Historical Provider, MD  budesonide (PULMICORT) 180 MCG/ACT inhaler Inhale 2 puffs into the lungs 2 (two) times daily. 12/11/14   Danelle Berry, PA-C  diazepam (VALIUM) 5 MG tablet Take 1 tablet (5 mg total) by mouth every 12 (twelve) hours as needed for muscle spasms. 12/11/14   Danelle Berry, PA-C  fluticasone-salmeterol (ADVAIR HFA) 115-21 MCG/ACT inhaler Inhale 2 puffs into the lungs 2 (two) times daily.    Historical Provider, MD  HYDROcodone-acetaminophen (NORCO/VICODIN) 5-325 MG tablet Take 2 tablets by mouth every 4 (four) hours as needed. 12/11/14   Danelle Berry, PA-C  metroNIDAZOLE (FLAGYL) 500 MG tablet Take 1 tablet (500 mg total) by mouth 2 (two) times  daily after a meal. 10/28/14   Jene Every, MD  predniSONE (DELTASONE) 20 MG tablet Take 2 tablets (40 mg total) by mouth daily. 12/11/14   Danelle Berry, PA-C  SUMAtriptan (IMITREX) 100 MG tablet Take 100 mg by mouth every 2 (two) hours as needed for migraine. May repeat in 2 hours if headache persists or recurs.    Historical Provider, MD  SUMAtriptan (IMITREX) 6 MG/0.5ML SOLN injection Inject 6 mg into the skin every 2 (two) hours as needed for migraine or headache. May repeat in 2 hours if headache persists or  recurs.    Historical Provider, MD  topiramate (TOPAMAX) 200 MG tablet Take 200 mg by mouth 2 (two) times daily.    Historical Provider, MD   BP 119/55 mmHg  Pulse 71  Temp(Src) 97.9 F (36.6 C) (Oral)  Resp 16  SpO2 100% Physical Exam  Constitutional: She is oriented to person, place, and time. She appears well-developed and well-nourished. No distress.  HENT:  Head: Normocephalic and atraumatic.  Nose: Nose normal.  Mouth/Throat: Oropharynx is clear and moist. No oropharyngeal exudate.  Eyes: Conjunctivae and EOM are normal. Pupils are equal, round, and reactive to light. Right eye exhibits no discharge. Left eye exhibits no discharge. No scleral icterus.  Neck: Normal range of motion. Neck supple. No JVD present. No tracheal deviation present. No thyromegaly present.  Cardiovascular: Normal rate, regular rhythm, normal heart sounds and intact distal pulses.  Exam reveals no gallop and no friction rub.   No murmur heard. Pulmonary/Chest: Effort normal and breath sounds normal. No respiratory distress. She has no wheezes. She has no rales. She exhibits no tenderness.  Abdominal: Soft. Bowel sounds are normal. She exhibits no distension and no mass. There is tenderness. There is no rebound and no guarding.  To right upper quadrant, negative Murphy's, no guarding no rebound tenderness, negative CVA tenderness  Musculoskeletal: Normal range of motion. She exhibits no edema or tenderness.  Normal range of motion of back, tenderness to palpation over right thoracic paraspinal muscles, new asymmetry, new muscle spasm present, no tenderness to spinal processes from cervical to lumbar spine  Lymphadenopathy:    She has no cervical adenopathy.  Neurological: She is alert and oriented to person, place, and time. She has normal reflexes. No cranial nerve deficit. She exhibits normal muscle tone. Coordination normal.  Skin: Skin is warm and dry. No rash noted. She is not diaphoretic. No erythema. No  pallor.  Psychiatric: She has a normal mood and affect. Her behavior is normal. Judgment and thought content normal.  Nursing note and vitals reviewed.   ED Course  Procedures  DIAGNOSTIC STUDIES: Oxygen Saturation is 100% on RA, normal by my interpretation.    COORDINATION OF CARE: 3:28 PM Discussed treatment plan with pt at bedside and pt agreed to plan. Will order breathing treament and chest Xray and urinalysis.   Labs Review Labs Reviewed  URINALYSIS, ROUTINE W REFLEX MICROSCOPIC (NOT AT [facility]) - Abnormal; Notable for the following:    APPearance TURBID (*)    Hgb urine dipstick SMALL (*)    All other components within normal limits  URINE MICROSCOPIC-ADD ON - Abnormal; Notable for the following:    Bacteria, UA FEW (*)    All other components within normal limits    Imaging Review Dg Chest 2 View  12/11/2014   CLINICAL DATA:  Shortness of breath and chest pain with deep breathing  EXAM: CHEST  2 VIEW  COMPARISON:  None.  FINDINGS:  Lungs are clear. Heart size and pulmonary vascularity are normal. No adenopathy. No pneumothorax. No bone lesions.  IMPRESSION: No edema or consolidation.   Electronically Signed   By: Bretta Bang III M.D.   On: 12/11/2014 16:00   Ct Renal Stone Study  12/11/2014   CLINICAL DATA:  RIGHT-sided back pain for several months, increase in the past few weeks.  EXAM: CT ABDOMEN AND PELVIS WITHOUT CONTRAST  TECHNIQUE: Multidetector CT imaging of the abdomen and pelvis was performed following the standard protocol without IV contrast.  COMPARISON:  None.  FINDINGS: No intrarenal or proximal ureteral calculi on either side. No evidence of hydronephrosis or other secondary signs of upper urinary tract obstruction. Within limits of unenhanced technique, normal appearing kidneys.  Again, within limits of unenhanced technique, remaining visualized upper abdomen unremarkable. Visualized extreme lung bases clear.  No distal ureteral calculi on either side. Appendix  identified and normal.  Lumbar disc disease is present at L4-5 central and to the RIGHT. Correlate clinically for symptomatic RIGHT L4 or RIGHT L5 impingement. Superimposed facet arthropathy is contributory. There are no worrisome osseous lesions.  IMPRESSION: Unremarkable CT urogram.  No calculi or hydronephrosis.   Electronically Signed   By: Elsie Stain M.D.   On: 12/11/2014 17:50   I have personally reviewed and evaluated these images and lab results as part of my medical decision-making.  MDM   Final diagnoses:  Asthma exacerbation attacks, mild persistent  Right-sided thoracic back pain  Right flank pain    Patient with vague right back pain, with pleuritic qualities, also history of worsening asthma, not controlled with her rescue inhaler, also complaining of worsening pain with riding in the car hitting bumps in the road or walking downstairs.  Pain is located over the right posterior thoracic ribs, flank area Differential is wide including pneumonia, pyelonephritis, chest wall pain from coughing versus back pain  Patient chest x-ray was negative for any acute pathology. After breathing treatment she states her breathing feels better but the pain in her back was unchanged. Urinalysis was pertinent for hematuria, few bacteria and urate and phosphates, will obtain a CT renal stone study to rule out nephrolithiasis. The patient with 3 months of back pain did not originally present like a kidney stone, but patient is persistent, not satisfied with the chest wall pain or back pain diagnoses. She has become tearful in the ER.    10:15 AM CT renal stone study results are pending, and Tatyana Kirichenko PA-C will disposition pt pending +/- results. Plan for D/C:  No kidney stone - pt to be d/c home with valium for muscle spasm (already taking flexeril w/o improvement) and norco (unable to take NSAIDS due to GI bleed) D/C home with kidneystone:  flomax and pain meds  Prednisone taper for asthma  exacerbation/bronchitis, with ICS for better asthma control.  Will follow up with PCP re: asthma sx   I personally performed the services described in this documentation, which was scribed in my presence. The recorded information has been reviewed and is accurate.    Danelle Berry, PA-C 12/13/14 1017  Lavera Guise, MD 12/14/14 1248

## 2014-12-11 NOTE — ED Notes (Signed)
Pt sts mid back pain chronic in nature worse x 1 month; pt denies obvious injury

## 2014-12-11 NOTE — ED Notes (Signed)
Pt c/o lower back pain she is uncomfortable in the chair  She cannot get comfortable

## 2015-07-26 ENCOUNTER — Other Ambulatory Visit: Payer: Self-pay | Admitting: Emergency Medicine

## 2015-07-26 ENCOUNTER — Emergency Department
Admission: EM | Admit: 2015-07-26 | Discharge: 2015-07-26 | Disposition: A | Discharge status: Home / Self Care | Payer: BLUE CROSS/BLUE SHIELD | Attending: Emergency Medicine | Admitting: Emergency Medicine

## 2015-07-26 ENCOUNTER — Emergency Department: Payer: BLUE CROSS/BLUE SHIELD

## 2015-07-26 DIAGNOSIS — J45909 Unspecified asthma, uncomplicated: Secondary | ICD-10-CM | POA: Insufficient documentation

## 2015-07-26 DIAGNOSIS — Z7952 Long term (current) use of systemic steroids: Secondary | ICD-10-CM | POA: Insufficient documentation

## 2015-07-26 DIAGNOSIS — Z79899 Other long term (current) drug therapy: Secondary | ICD-10-CM | POA: Insufficient documentation

## 2015-07-26 DIAGNOSIS — M1990 Unspecified osteoarthritis, unspecified site: Secondary | ICD-10-CM | POA: Diagnosis not present

## 2015-07-26 DIAGNOSIS — R1013 Epigastric pain: Secondary | ICD-10-CM

## 2015-07-26 DIAGNOSIS — Z7951 Long term (current) use of inhaled steroids: Secondary | ICD-10-CM | POA: Insufficient documentation

## 2015-07-26 DIAGNOSIS — Z792 Long term (current) use of antibiotics: Secondary | ICD-10-CM | POA: Insufficient documentation

## 2015-07-26 DIAGNOSIS — R079 Chest pain, unspecified: Secondary | ICD-10-CM | POA: Insufficient documentation

## 2015-07-26 LAB — COMPREHENSIVE METABOLIC PANEL
ALBUMIN: 4.3 g/dL (ref 3.5–5.0)
ALT: 24 U/L (ref 14–54)
AST: 28 U/L (ref 15–41)
Alkaline Phosphatase: 101 U/L (ref 38–126)
Anion gap: 11 (ref 5–15)
BUN: 20 mg/dL (ref 6–20)
CALCIUM: 9.4 mg/dL (ref 8.9–10.3)
CHLORIDE: 102 mmol/L (ref 101–111)
CO2: 26 mmol/L (ref 22–32)
Creatinine, Ser: 0.92 mg/dL (ref 0.44–1.00)
GFR calc non Af Amer: >60 mL/min (ref >60)
GLUCOSE: 128 mg/dL — AB (ref 65–99)
POTASSIUM: 4.1 mmol/L (ref 3.5–5.1)
Sodium: 139 mmol/L (ref 135–145)
Total Bilirubin: 0.8 mg/dL (ref 0.3–1.2)
Total Protein: 7.8 g/dL (ref 6.5–8.1)

## 2015-07-26 LAB — CBC WITH DIFFERENTIAL/PLATELET
Basophils Absolute: 0 10*3/uL (ref 0–0.1)
EOS ABS: 0.1 10*3/uL (ref 0–0.7)
HCT: 37.2 % (ref 35.0–47.0)
HEMOGLOBIN: 12.4 g/dL (ref 12.0–16.0)
LYMPHS ABS: 0.9 10*3/uL — AB (ref 1.0–3.6)
Lymphocytes Relative: 11 %
MCH: 30 pg (ref 26.0–34.0)
MCHC: 33.3 g/dL (ref 32.0–36.0)
MCV: 90.1 fL (ref 80.0–100.0)
Monocytes Absolute: 0.5 10*3/uL (ref 0.2–0.9)
Neutro Abs: 7.2 10*3/uL — ABNORMAL HIGH (ref 1.4–6.5)
Neutrophils Relative %: 82 %
PLATELETS: 205 10*3/uL (ref 150–440)
RBC: 4.13 MIL/uL (ref 3.80–5.20)
RDW: 14.9 % — AB (ref 11.5–14.5)
WBC: 8.8 10*3/uL (ref 3.6–11.0)

## 2015-07-26 LAB — TROPONIN I

## 2015-07-26 LAB — LIPASE, BLOOD: Lipase: 18 U/L (ref 11–51)

## 2015-07-26 MED ORDER — SUCRALFATE 1 G PO TABS
1.0000 g | ORAL_TABLET | Freq: Once | ORAL | Status: AC
  Administered 2015-07-26: 1 g via ORAL
  Filled 2015-07-26: qty 1

## 2015-07-26 MED ORDER — BUTALBITAL-APAP-CAFFEINE 50-325-40 MG PO TABS
2.0000 | ORAL_TABLET | Freq: Once | ORAL | Status: AC
  Administered 2015-07-26: 2 via ORAL

## 2015-07-26 MED ORDER — SUCRALFATE 1 G PO TABS: 1.0000 g | ORAL_TABLET | Freq: Two times a day (BID) | ORAL | Status: AC

## 2015-07-26 MED ORDER — BUTALBITAL-APAP-CAFFEINE 50-325-40 MG PO TABS
ORAL_TABLET | ORAL | Status: AC
  Filled 2015-07-26: qty 2

## 2015-07-26 MED ORDER — BUTALBITAL-APAP-CAFFEINE 50-325-40 MG PO TABS: 1.0000 | ORAL_TABLET | Freq: Three times a day (TID) | ORAL | Status: AC | PRN

## 2015-07-26 NOTE — ED Provider Notes (Signed)
[facility] Emergency Department Provider Note   ____________________________________________  Time seen: Approximately 0041 AM  I have reviewed the triage vital signs and the nursing notes.   HISTORY  Chief Complaint Chest pain    HPI [name] is a 50 y.o. female who comes into the hospital today with chest pain. She reports that she's had the symptoms for the past 1112 hrs. She thought the symptoms are due to reflux but she took reflux medication and did not help. The patient has been given aspirin, nitroglycerin and Nitropaste to her chest. Currently her pain is a 3-4 out of 10 in intensity and has eased off. She is also been given Zofran by EMS when she was feeling nauseous. She was working when the pain started in the middle of her chest. She reports that the pain does not radiate. She felt little sweaty earlier with some nausea and she's never had pain like this before. She reports that she is typically in good health. She did have some vomiting and diarrhea tonight before she called EMS. The patient is concerned about the symptoms that she decided to come into the hospital to get checked out.The patient endorses abdominal pain as well.    Past Medical History  Diagnosis Date  . Asthma   . Migraine   . Multiple gastric ulcers   . Anemia   . Arthritis   . Plantar fasciitis   . Bursitis     There are no active problems to display for this patient.   Past Surgical History  Procedure Laterality Date  . Tubal ligation    . Carpal tunnel release Right     Current Outpatient Rx  Name  Route  Sig  Dispense  Refill  . albuterol (PROVENTIL HFA;VENTOLIN HFA) 108 (90 BASE) MCG/ACT inhaler   Inhalation   Inhale 2 puffs into the lungs.         . budesonide (PULMICORT) 180 MCG/ACT inhaler   Inhalation   Inhale 2 puffs into the lungs 2 (two) times daily.   1 Inhaler   0   . butalbital-acetaminophen-caffeine (FIORICET) 50-325-40 MG tablet  Oral   Take 1-2 tablets by mouth 3 (three) times daily as needed for headache.   20 tablet   0   . diazepam (VALIUM) 5 MG tablet   Oral   Take 1 tablet (5 mg total) by mouth every 12 (twelve) hours as needed for muscle spasms.   5 tablet   0   . fluticasone-salmeterol (ADVAIR HFA) 115-21 MCG/ACT inhaler   Inhalation   Inhale 2 puffs into the lungs 2 (two) times daily.         Marland Kitchen HYDROcodone-acetaminophen (NORCO/VICODIN) 5-325 MG tablet   Oral   Take 2 tablets by mouth every 4 (four) hours as needed.   6 tablet   0   . metroNIDAZOLE (FLAGYL) 500 MG tablet   Oral   Take 1 tablet (500 mg total) by mouth 2 (two) times daily after a meal.   14 tablet   0   . predniSONE (DELTASONE) 20 MG tablet   Oral   Take 2 tablets (40 mg total) by mouth daily.   10 tablet   0   . sucralfate (CARAFATE) 1 g tablet   Oral   Take 1 tablet (1 g total) by mouth 2 (two) times daily.   20 tablet   0   . SUMAtriptan (IMITREX) 100 MG tablet   Oral   Take  100 mg by mouth every 2 (two) hours as needed for migraine. May repeat in 2 hours if headache persists or recurs.         . SUMAtriptan (IMITREX) 6 MG/0.5ML SOLN injection   Subcutaneous   Inject 6 mg into the skin every 2 (two) hours as needed for migraine or headache. May repeat in 2 hours if headache persists or recurs.         . topiramate (TOPAMAX) 200 MG tablet   Oral   Take 200 mg by mouth 2 (two) times daily.           Allergies Aspirin; Augmentin; and Tramadol  No family history on file.  Social History Social History  Substance Use Topics  . Smoking status: Never Smoker   . Smokeless tobacco: Never Used  . Alcohol Use: No    Review of Systems Constitutional: Sweats Eyes: No visual changes. ENT: No sore throat. Cardiovascular: chest pain. Respiratory: shortness of breath. Gastrointestinal: abdominal pain, nausea, vomiting.  No diarrhea.  No constipation. Genitourinary: Negative for  dysuria. Musculoskeletal: Negative for back pain. Skin: Negative for rash. Neurological: Negative for headaches, focal weakness or numbness.  10-point ROS otherwise negative.  ____________________________________________   PHYSICAL EXAM:  VITAL SIGNS: ED Triage Vitals  Enc Vitals Group     BP 07/26/15 0524 139/69 mmHg     Pulse Rate 07/26/15 0524 94     Resp 07/26/15 0524 16     Temp --      Temp src --      SpO2 07/26/15 0524 100 %     Weight --      Height --      Head Cir --      Peak Flow --      Pain Score 07/26/15 0523 2     Pain Loc --      Pain Edu? --      Excl. in GC? --     Constitutional: Alert and oriented. Well appearing and in Moderate distress. Eyes: Conjunctivae are normal. PERRL. EOMI. Head: Atraumatic. Nose: No congestion/rhinnorhea. Mouth/Throat: Mucous membranes are moist.  Oropharynx non-erythematous. Cardiovascular: Normal rate, regular rhythm. Grossly normal heart sounds.  Good peripheral circulation. Respiratory: Normal respiratory effort.  No retractions. Lungs CTAB. Gastrointestinal: Soft with epigastric and right upper quadrant tenderness to palpation. No distention. Positive bowel sounds Musculoskeletal: No lower extremity tenderness nor edema.   Neurologic:  Normal speech and language. Skin:  Skin is warm, dry and intact.  Psychiatric: Mood and affect are normal.   ____________________________________________   LABS (all labs ordered are listed, but only abnormal results are displayed)  Labs Reviewed  TROPONIN I   ____________________________________________  EKG  ED ECG REPORT I, Rebecka Apley, the attending physician, personally viewed and interpreted this ECG.   Date: 07/26/2015  EKG Time: 0054  Rate: 86  Rhythm: normal sinus rhythm  Axis: normal  Intervals:none  ST&T Change: none  ____________________________________________  RADIOLOGY  Ultrasound abdomen: Normal  examination ____________________________________________   PROCEDURES  Procedure(s) performed: None  Critical Care performed: No  ____________________________________________   INITIAL IMPRESSION / ASSESSMENT AND PLAN / ED COURSE  Pertinent labs & imaging results that were available during my care of the patient were reviewed by me and considered in my medical decision making (see chart for details).  This is a 50 year old female who comes into the hospital today with some chest pain, nausea vomiting diarrhea and some abdominal pain. The patient did receive some  medication by EMS but I gave her some GI cocktail and 2 mg of morphine. The patient did develop a headache so I also gave her some Fioricet. The patient had an ultrasound done that was negative. Her blood work was unremarkable including her lipase and her white blood cell count. After the medication the patient's pain was improved. I repeated troponin which was negative. I explained the results of the patient and that she will be discharged home and needs to follow-up with her primary care physician. I will give the patient a prescription for Carafate as I did give her a dose of Carafate as well and she will follow up with her primary care physician. The patient has no further questions or complaints at this time. ____________________________________________   FINAL CLINICAL IMPRESSION(S) / ED DIAGNOSES  Final diagnoses:  Chest pain, unspecified chest pain type      NEW MEDICATIONS STARTED DURING THIS VISIT:  Discharge Medication List as of 07/26/2015  5:32 AM    START taking these medications   Details  sucralfate (CARAFATE) 1 g tablet Take 1 tablet (1 g total) by mouth 2 (two) times daily., Starting 07/26/2015, Until Discontinued, Print         Note:  This document was prepared using Dragon voice recognition software and may include unintentional dictation errors.    Rebecka ApleyAllison P Webster, MD 07/26/15 [phone]

## 2015-07-26 NOTE — Discharge Instructions (Signed)
Nonspecific Chest Pain  °Chest pain can be caused by many different conditions. There is always a chance that your pain could be related to something serious, such as a heart attack or a blood clot in your lungs. Chest pain can also be caused by conditions that are not life-threatening. If you have chest pain, it is very important to follow up with your health care provider. °CAUSES  °Chest pain can be caused by: °· Heartburn. °· Pneumonia or bronchitis. °· Anxiety or stress. °· Inflammation around your heart (pericarditis) or lung (pleuritis or pleurisy). °· A blood clot in your lung. °· A collapsed lung (pneumothorax). It can develop suddenly on its own (spontaneous pneumothorax) or from trauma to the chest. °· Shingles infection (varicella-zoster virus). °· Heart attack. °· Damage to the bones, muscles, and cartilage that make up your chest wall. This can include: °· Bruised bones due to injury. °· Strained muscles or cartilage due to frequent or repeated coughing or overwork. °· Fracture to one or more ribs. °· Sore cartilage due to inflammation (costochondritis). °RISK FACTORS  °Risk factors for chest pain may include: °· Activities that increase your risk for trauma or injury to your chest. °· Respiratory infections or conditions that cause frequent coughing. °· Medical conditions or overeating that can cause heartburn. °· Heart disease or family history of heart disease. °· Conditions or health behaviors that increase your risk of developing a blood clot. °· Having had chicken pox (varicella zoster). °SIGNS AND SYMPTOMS °Chest pain can feel like: °· Burning or tingling on the surface of your chest or deep in your chest. °· Crushing, pressure, aching, or squeezing pain. °· Dull or sharp pain that is worse when you move, cough, or take a deep breath. °· Pain that is also felt in your back, neck, shoulder, or arm, or pain that spreads to any of these areas. °Your chest pain may come and go, or it may stay  constant. °DIAGNOSIS °Lab tests or other studies may be needed to find the cause of your pain. Your health care provider may have you take a test called an ambulatory ECG (electrocardiogram). An ECG records your heartbeat patterns at the time the test is performed. You may also have other tests, such as: °· Transthoracic echocardiogram (TTE). During echocardiography, sound waves are used to create a picture of all of the heart structures and to look at how blood flows through your heart. °· Transesophageal echocardiogram (TEE). This is a more advanced imaging test that obtains images from inside your body. It allows your health care provider to see your heart in finer detail. °· Cardiac monitoring. This allows your health care provider to monitor your heart rate and rhythm in real time. °· Holter monitor. This is a portable device that records your heartbeat and can help to diagnose abnormal heartbeats. It allows your health care provider to track your heart activity for several days, if needed. °· Stress tests. These can be done through exercise or by taking medicine that makes your heart beat more quickly. °· Blood tests. °· Imaging tests. °TREATMENT  °Your treatment depends on what is causing your chest pain. Treatment may include: °· Medicines. These may include: °¨ Acid blockers for heartburn. °¨ Anti-inflammatory medicine. °¨ Pain medicine for inflammatory conditions. °¨ Antibiotic medicine, if an infection is present. °¨ Medicines to dissolve blood clots. °¨ Medicines to treat coronary artery disease. °· Supportive care for conditions that do not require medicines. This may include: °¨ Resting. °¨ Applying heat   or cold packs to injured areas. °¨ Limiting activities until pain decreases. °HOME CARE INSTRUCTIONS °· If you were prescribed an antibiotic medicine, finish it all even if you start to feel better. °· Avoid any activities that bring on chest pain. °· Do not use any tobacco products, including  cigarettes, chewing tobacco, or electronic cigarettes. If you need help quitting, ask your health care provider. °· Do not drink alcohol. °· Take medicines only as directed by your health care provider. °· Keep all follow-up visits as directed by your health care provider. This is important. This includes any further testing if your chest pain does not go away. °· If heartburn is the cause for your chest pain, you may be told to keep your head raised (elevated) while sleeping. This reduces the chance that acid will go from your stomach into your esophagus. °· Make lifestyle changes as directed by your health care provider. These may include: °¨ Getting regular exercise. Ask your health care provider to suggest some activities that are safe for you. °¨ Eating a heart-healthy diet. A registered dietitian can help you to learn healthy eating options. °¨ Maintaining a healthy weight. °¨ Managing diabetes, if necessary. °¨ Reducing stress. °SEEK MEDICAL CARE IF: °· Your chest pain does not go away after treatment. °· You have a rash with blisters on your chest. °· You have a fever. °SEEK IMMEDIATE MEDICAL CARE IF:  °· Your chest pain is worse. °· You have an increasing cough, or you cough up blood. °· You have severe abdominal pain. °· You have severe weakness. °· You faint. °· You have chills. °· You have sudden, unexplained chest discomfort. °· You have sudden, unexplained discomfort in your arms, back, neck, or jaw. °· You have shortness of breath at any time. °· You suddenly start to sweat, or your skin gets clammy. °· You feel nauseous or you vomit. °· You suddenly feel light-headed or dizzy. °· Your heart begins to beat quickly, or it feels like it is skipping beats. °These symptoms may represent a serious problem that is an emergency. Do not wait to see if the symptoms will go away. Get medical help right away. Call your local emergency services (911 in the [place] Do not drive yourself to the hospital. °  °This  information is not intended to replace advice given to you by your health care provider. Make sure you discuss any questions you have with your health care provider. °  °Document Released: 12/05/2004 Document Revised: 03/18/2014 Document Reviewed: 10/01/2013 °Elsevier Interactive Patient Education ©2016 [employer] ° °Abdominal Pain, Adult °Many things can cause abdominal pain. Usually, abdominal pain is not caused by a disease and will improve without treatment. It can often be observed and treated at home. Your health care provider will do a physical exam and possibly order blood tests and X-rays to help determine the seriousness of your pain. However, in many cases, more time must pass before a clear cause of the pain can be found. Before that point, your health care provider may not know if you need more testing or further treatment. °HOME CARE INSTRUCTIONS °Monitor your abdominal pain for any changes. The following actions may help to alleviate any discomfort you are experiencing: °· Only take over-the-counter or prescription medicines as directed by your health care provider. °· Do not take laxatives unless directed to do so by your health care provider. °· Try a clear liquid diet (broth, tea, or water) as directed by your health care   provider. Slowly move to a bland diet as tolerated. °SEEK MEDICAL CARE IF: °· You have unexplained abdominal pain. °· You have abdominal pain associated with nausea or diarrhea. °· You have pain when you urinate or have a bowel movement. °· You experience abdominal pain that wakes you in the night. °· You have abdominal pain that is worsened or improved by eating food. °· You have abdominal pain that is worsened with eating fatty foods. °· You have a fever. °SEEK IMMEDIATE MEDICAL CARE IF: °· Your pain does not go away within 2 hours. °· You keep throwing up (vomiting). °· Your pain is felt only in portions of the abdomen, such as the right side or the left lower portion of the  abdomen. °· You pass bloody or black tarry stools. °MAKE SURE YOU: °· Understand these instructions. °· Will watch your condition. °· Will get help right away if you are not doing well or get worse. °  °This information is not intended to replace advice given to you by your health care provider. Make sure you discuss any questions you have with your health care provider. °  °Document Released: 12/05/2004 Document Revised: 11/16/2014 Document Reviewed: 11/04/2012 °Elsevier Interactive Patient Education ©2016 [employer] ° °

## 2016-10-04 ENCOUNTER — Emergency Department
Admission: EM | Admit: 2016-10-04 | Discharge: 2016-10-04 | Disposition: A | Discharge status: Home / Self Care | Payer: BLUE CROSS/BLUE SHIELD | Attending: Student in an Organized Health Care Education/Training Program | Admitting: Student in an Organized Health Care Education/Training Program

## 2016-10-04 ENCOUNTER — Emergency Department: Payer: BLUE CROSS/BLUE SHIELD

## 2016-10-04 ENCOUNTER — Encounter: Payer: Self-pay | Admitting: Emergency Medicine

## 2016-10-04 DIAGNOSIS — E039 Hypothyroidism, unspecified: Secondary | ICD-10-CM | POA: Insufficient documentation

## 2016-10-04 DIAGNOSIS — R5383 Other fatigue: Secondary | ICD-10-CM | POA: Insufficient documentation

## 2016-10-04 DIAGNOSIS — J45909 Unspecified asthma, uncomplicated: Secondary | ICD-10-CM | POA: Insufficient documentation

## 2016-10-04 DIAGNOSIS — Z79899 Other long term (current) drug therapy: Secondary | ICD-10-CM | POA: Insufficient documentation

## 2016-10-04 DIAGNOSIS — J029 Acute pharyngitis, unspecified: Secondary | ICD-10-CM | POA: Diagnosis present

## 2016-10-04 LAB — COMPREHENSIVE METABOLIC PANEL
ALT: 18 U/L (ref 14–54)
AST: 18 U/L (ref 15–41)
Albumin: 4.4 g/dL (ref 3.5–5.0)
Alkaline Phosphatase: 98 U/L (ref 38–126)
Anion gap: 8 (ref 5–15)
BUN: 17 mg/dL (ref 6–20)
CHLORIDE: 108 mmol/L (ref 101–111)
CO2: 25 mmol/L (ref 22–32)
Calcium: 9.5 mg/dL (ref 8.9–10.3)
Creatinine, Ser: 0.96 mg/dL (ref 0.44–1.00)
Glucose, Bld: 100 mg/dL — ABNORMAL HIGH (ref 65–99)
POTASSIUM: 3.8 mmol/L (ref 3.5–5.1)
SODIUM: 141 mmol/L (ref 135–145)
Total Bilirubin: 0.4 mg/dL (ref 0.3–1.2)
Total Protein: 7.6 g/dL (ref 6.5–8.1)

## 2016-10-04 LAB — CBC WITH DIFFERENTIAL/PLATELET
BASOS ABS: 0 10*3/uL (ref 0–0.1)
Basophils Relative: 0 %
EOS ABS: 0.1 10*3/uL (ref 0–0.7)
EOS PCT: 2 %
HCT: 35 % (ref 35.0–47.0)
Hemoglobin: 12 g/dL (ref 12.0–16.0)
LYMPHS PCT: 26 %
Lymphs Abs: 1.8 10*3/uL (ref 1.0–3.6)
MCH: 31.5 pg (ref 26.0–34.0)
MCHC: 34.3 g/dL (ref 32.0–36.0)
MCV: 91.7 fL (ref 80.0–100.0)
MONO ABS: 0.4 10*3/uL (ref 0.2–0.9)
Monocytes Relative: 6 %
Neutro Abs: 4.5 10*3/uL (ref 1.4–6.5)
Neutrophils Relative %: 66 %
PLATELETS: 200 10*3/uL (ref 150–440)
RBC: 3.82 MIL/uL (ref 3.80–5.20)
RDW: 14.7 % — AB (ref 11.5–14.5)
WBC: 6.8 10*3/uL (ref 3.6–11.0)

## 2016-10-04 LAB — URINALYSIS, COMPLETE (UACMP) WITH MICROSCOPIC
Bilirubin Urine: NEGATIVE
GLUCOSE, UA: NEGATIVE mg/dL
KETONES UR: NEGATIVE mg/dL
Leukocytes, UA: NEGATIVE
NITRITE: NEGATIVE
PROTEIN: NEGATIVE mg/dL
Specific Gravity, Urine: 1.006 (ref 1.005–1.030)
pH: 6 (ref 5.0–8.0)

## 2016-10-04 LAB — TSH: TSH: 5.245 u[IU]/mL — ABNORMAL HIGH (ref 0.350–4.500)

## 2016-10-04 LAB — MONONUCLEOSIS SCREEN: Mono Screen: NEGATIVE

## 2016-10-04 NOTE — ED Provider Notes (Signed)
[facility] Emergency Department Provider Note  ____________________________________________  Time seen: Approximately 8:00 PM  I have reviewed the triage vital signs and the nursing notes.   HISTORY  Chief Complaint Sore Throat and Fatigue    HPI [name] is a 51 y.o. female presenting to the emergency department with fatigue and pharyngitis for the past 2-3 weeks. Patient states that she had symptoms consistent with an upper respiratory tract infection approximately 2 weeks ago. Patient states that her symptoms seemingly improved but then acutely worsened. Patient has noticed a cough that has occurred for the past several days worse at night. Patient denies purulent sputum production. She denies weight loss or weight gain. She has been afebrile. Patient has not been diagnosed with hypothyroidism. Patient states that she tested negative for strep in the emergency department. She denies associated chest pain, chest tightness, nausea, vomiting, abdominal pain, weight loss or weight gain her knowledge of current malignancy.   Past Medical History:  Diagnosis Date  . Anemia   . Arthritis   . Asthma   . Bursitis   . Migraine   . Multiple gastric ulcers   . Plantar fasciitis     There are no active problems to display for this patient.   Past Surgical History:  Procedure Laterality Date  . CARPAL TUNNEL RELEASE Right   . TUBAL LIGATION      Prior to Admission medications   Medication Sig Start Date End Date Taking? Authorizing Provider  albuterol (PROVENTIL HFA;VENTOLIN HFA) 108 (90 BASE) MCG/ACT inhaler Inhale 2 puffs into the lungs.    [provider]  budesonide (PULMICORT) 180 MCG/ACT inhaler Inhale 2 puffs into the lungs 2 (two) times daily. 12/11/14   Danelle Berryapia, Leisa, PA-C  diazepam (VALIUM) 5 MG tablet Take 1 tablet (5 mg total) by mouth every 12 (twelve) hours as needed for muscle spasms. 12/11/14   Danelle Berryapia, Leisa, PA-C   fluticasone-salmeterol (ADVAIR HFA) 454-09115-21 MCG/ACT inhaler Inhale 2 puffs into the lungs 2 (two) times daily.    [provider]  HYDROcodone-acetaminophen (NORCO/VICODIN) 5-325 MG tablet Take 2 tablets by mouth every 4 (four) hours as needed. 12/11/14   Danelle Berryapia, Leisa, PA-C  metroNIDAZOLE (FLAGYL) 500 MG tablet Take 1 tablet (500 mg total) by mouth 2 (two) times daily after a meal. 10/28/14   Jene EveryKinner, Robert, MD  predniSONE (DELTASONE) 20 MG tablet Take 2 tablets (40 mg total) by mouth daily. 12/11/14   Danelle Berryapia, Leisa, PA-C  sucralfate (CARAFATE) 1 g tablet Take 1 tablet (1 g total) by mouth 2 (two) times daily. 07/26/15   Rebecka ApleyWebster, Allison P, MD  SUMAtriptan (IMITREX) 100 MG tablet Take 100 mg by mouth every 2 (two) hours as needed for migraine. May repeat in 2 hours if headache persists or recurs.    [provider]  SUMAtriptan (IMITREX) 6 MG/0.5ML SOLN injection Inject 6 mg into the skin every 2 (two) hours as needed for migraine or headache. May repeat in 2 hours if headache persists or recurs.    [provider]  topiramate (TOPAMAX) 200 MG tablet Take 200 mg by mouth 2 (two) times daily.    [provider]    Allergies Aspirin; Augmentin [amoxicillin-pot clavulanate]; and Tramadol  No family history on file.  Social History Social History  Substance Use Topics  . Smoking status: Never Smoker  . Smokeless tobacco: Never Used  . Alcohol use No    Review of Systems  Constitutional: Patient has fatigue. Eyes: No visual  changes. No discharge ENT: Patient has pharyngitis. Cardiovascular: no chest pain. Respiratory: no cough. No SOB. Gastrointestinal: No abdominal pain.  No nausea, no vomiting.  No diarrhea.  No constipation. Musculoskeletal: Negative for musculoskeletal pain. Skin: Negative for rash, abrasions, lacerations, ecchymosis. Neurological: Negative for headaches, focal weakness or  numbness. ____________________________________________   PHYSICAL EXAM:  VITAL SIGNS: ED Triage Vitals  Enc Vitals Group     BP 10/04/16 1646 138/66     Pulse Rate 10/04/16 1646 85     Resp 10/04/16 1646 18     Temp 10/04/16 1646 97.7 F (36.5 C)     Temp Source 10/04/16 1646 Oral     SpO2 10/04/16 1646 100 %     Weight 10/04/16 1642 222 lb (100.7 kg)     Height 10/04/16 1642 5\' 5"  (1.651 m)     Head Circumference --      Peak Flow --      Pain Score 10/04/16 1642 8     Pain Loc --      Pain Edu? --      Excl. in GC? --      Constitutional: Alert and oriented. Well appearing and in no acute distress. Eyes: Conjunctivae are normal. PERRL. EOMI. Head: Atraumatic. ENT:      Ears: Tympanic membranes are pearly bilaterally.      Nose: No congestion/rhinnorhea.      Mouth/Throat: Mucous membranes are moist. Posterior pharynx is erythematous. Uvula is midline. No tonsillar exudate or hypertrophy. Neck: Full range of motion. Hematological/Lymphatic/Immunilogical: Palpable cervical lymphadenopathy. Cardiovascular: Normal rate, regular rhythm. Normal S1 and S2.  Good peripheral circulation. Respiratory: Normal respiratory effort without tachypnea or retractions. Lungs CTAB. Good air entry to the bases with no decreased or absent breath sounds. Gastrointestinal: Bowel sounds 4 quadrants. Soft and nontender to palpation. No guarding or rigidity. No palpable masses. No distention. No CVA tenderness. Musculoskeletal: Full range of motion to all extremities. No gross deformities appreciated. Neurologic:  Normal speech and language. No gross focal neurologic deficits are appreciated.  Skin:  Skin is warm, dry and intact. No rash noted. Psychiatric: Mood and affect are normal. Speech and behavior are normal. Patient exhibits appropriate insight and judgement.   ____________________________________________   LABS (all labs ordered are listed, but only abnormal results are  displayed)  Labs Reviewed  CBC WITH DIFFERENTIAL/PLATELET - Abnormal; Notable for the following:       Result Value   RDW 14.7 (*)    All other components within normal limits  COMPREHENSIVE METABOLIC PANEL - Abnormal; Notable for the following:    Glucose, Bld 100 (*)    All other components within normal limits  URINALYSIS, COMPLETE (UACMP) WITH MICROSCOPIC - Abnormal; Notable for the following:    Color, Urine STRAW (*)    APPearance CLEAR (*)    Hgb urine dipstick SMALL (*)    Bacteria, UA MANY (*)    Squamous Epithelial / LPF 0-5 (*)    All other components within normal limits  TSH - Abnormal; Notable for the following:    TSH 5.245 (*)    All other components within normal limits  MONONUCLEOSIS SCREEN   ____________________________________________  EKG   ____________________________________________  RADIOLOGY Geraldo PitterI, Kristalynn Coddington M Jessamyn Watterson, personally viewed and evaluated these images as part of my medical decision making, as well as reviewing the written report by the radiologist.    Dg Chest 2 View  Result Date: 10/04/2016 CLINICAL DATA:  Sore throat fatigue and cough EXAM: CHEST  2 VIEW COMPARISON:  12/11/2014 FINDINGS: The heart size and mediastinal contours are within normal limits. Both lungs are clear. The visualized skeletal structures are unremarkable. IMPRESSION: No active cardiopulmonary disease. Electronically Signed   By: Jasmine Pang M.D.   On: 10/04/2016 20:18    ____________________________________________    PROCEDURES  Procedure(s) performed:    Procedures    Medications - No data to display   ____________________________________________   INITIAL IMPRESSION / ASSESSMENT AND PLAN / ED COURSE  Pertinent labs & imaging results that were available during my care of the patient were reviewed by me and considered in my medical decision making (see chart for details).  Review of the [place] CSRS was performed in accordance of the [facility] prior to  dispensing any controlled drugs.     Assessment and plan Hypothyroidism Patient presents to the emergency department with persistent fatigue for the past 2-3 weeks. DG chest revealed no consolidations or findings consistent with pneumonia. CBC and CMP were reassuring. TSH was found to be elevated. Patient was advised to follow-up with her primary care provider for active thyroid panel. Patient education was provided regarding hypothyroidism. Vital signs are reassuring prior to discharge. All patient questions were answered.  ____________________________________________  FINAL CLINICAL IMPRESSION(S) / ED DIAGNOSES  Final diagnoses:  Hypothyroidism, unspecified type      NEW MEDICATIONS STARTED DURING THIS VISIT:  New Prescriptions   No medications on file        This chart was dictated using voice recognition software/Dragon. Despite best efforts to proofread, errors can occur which can change the meaning. Any change was purely unintentional.    Gasper Lloyd 10/04/16 2106    Willy Eddy, MD 10/05/16 [phone]

## 2016-10-04 NOTE — ED Notes (Signed)
Patient c/o sore throat, described as raw, and fatigue/exhaustion.  Patient reports difficulty swallowing due to pain and swelling of right throat.

## 2016-10-04 NOTE — ED Triage Notes (Signed)
C/O sore throat and swollen uvula a few weeks ago.  Seen by PCP, who told patient that she had had an allergic reaction.  Sympotms initially improved, but returned 2 days ago.  Also c/o fatigue for past 2-3 weeks.

## 2018-01-21 ENCOUNTER — Other Ambulatory Visit: Payer: Self-pay | Admitting: Family Medicine

## 2018-01-21 DIAGNOSIS — Z1231 Encounter for screening mammogram for malignant neoplasm of breast: Secondary | ICD-10-CM

## 2018-01-29 ENCOUNTER — Ambulatory Visit
Admission: RE | Admit: 2018-01-29 | Discharge: 2018-01-29 | Disposition: A | Discharge status: Home / Self Care | Payer: BLUE CROSS/BLUE SHIELD | Source: Ambulatory Visit | Attending: Family Medicine | Admitting: Family Medicine

## 2018-01-29 DIAGNOSIS — Z1231 Encounter for screening mammogram for malignant neoplasm of breast: Secondary | ICD-10-CM | POA: Diagnosis present

## 2018-12-14 ENCOUNTER — Ambulatory Visit
Admission: EM | Admit: 2018-12-14 | Discharge: 2018-12-14 | Disposition: A | Discharge status: Home / Self Care | Payer: BC Managed Care – PPO | Attending: Family Medicine | Admitting: Family Medicine

## 2018-12-14 ENCOUNTER — Other Ambulatory Visit: Payer: Self-pay

## 2018-12-14 DIAGNOSIS — R109 Unspecified abdominal pain: Secondary | ICD-10-CM

## 2018-12-14 LAB — URINALYSIS, COMPLETE (UACMP) WITH MICROSCOPIC
Bilirubin Urine: NEGATIVE
Glucose, UA: NEGATIVE mg/dL
Hgb urine dipstick: NEGATIVE
Ketones, ur: NEGATIVE mg/dL
Leukocytes,Ua: NEGATIVE
Nitrite: NEGATIVE
Protein, ur: NEGATIVE mg/dL
Specific Gravity, Urine: 1.015 (ref 1.005–1.030)
pH: 7 (ref 5.0–8.0)

## 2018-12-14 NOTE — Discharge Instructions (Addendum)
Monitor closely. Rest. Drink plenty of fluids.   Follow-up with gastroenterology as discussed.  This is important.  Follow up with your primary care physician this week. Return to Urgent care or ER for new or worsening concerns.

## 2018-12-14 NOTE — ED Provider Notes (Signed)
[facility] ____________________________________________  Time seen: Approximately 6:23 PM  I have reviewed the triage vital signs and the nursing notes.   HISTORY  Chief Complaint Abdominal Pain   HPI [name] is a 53 y.o. female presenting for evaluation of abdominal pain.  Patient states that she does not have any abdominal pain at this time.  States this past weekend she was having pain.  States that she has a history of this coming and going intermittently for several months.  Denies vomiting, diarrhea or constipation.  Does have a history of gastric ulcers stating approximately 7 to 8 years ago but has since recovered well.  Denies any abnormal colored, black or bloody stools.  Continues to eat and drink well.  No accompanying dysuria, chest pain, shortness of breath, vomiting, fevers or recent sickness.  Reports otherwise doing well.  Has not been seen by GI in several years, and has not had a colonoscopy since turning 45.  PCP: Rebeca Alert  Past Medical History:  Diagnosis Date  . Anemia   . Arthritis   . Asthma   . Bursitis   . Migraine   . Multiple gastric ulcers   . Plantar fasciitis     There are no active problems to display for this patient.   Past Surgical History:  Procedure Laterality Date  . CARPAL TUNNEL RELEASE Right   . TUBAL LIGATION       No current facility-administered medications for this encounter.   Current Outpatient Medications:  .  albuterol (PROVENTIL HFA;VENTOLIN HFA) 108 (90 BASE) MCG/ACT inhaler, Inhale 2 puffs into the lungs., Disp: , Rfl:  .  Apple Cider Vinegar 300 MG TABS, Take by mouth., Disp: , Rfl:  .  baclofen (LIORESAL) 10 MG tablet, Take by mouth., Disp: , Rfl:  .  Cinnamon 500 MG TABS, Take by mouth., Disp: , Rfl:  .  fluticasone-salmeterol (ADVAIR HFA) 115-21 MCG/ACT inhaler, Inhale 2 puffs into the lungs 2 (two) times daily., Disp: , Rfl:  .  lactobacillus acidophilus (BACID) TABS tablet, Take 2 tablets by  mouth 3 (three) times daily., Disp: , Rfl:  .  magnesium 30 MG tablet, Take 30 mg by mouth 2 (two) times daily., Disp: , Rfl:  .  medium chain triglycerides (MCT OIL) oil, Take by mouth 3 (three) times daily., Disp: , Rfl:  .  Multiple Vitamins-Minerals (MULTIVITAMIN WITH MINERALS) tablet, Take 1 tablet by mouth daily., Disp: , Rfl:  .  pantoprazole (PROTONIX) 40 MG tablet, , Disp: , Rfl:  .  sucralfate (CARAFATE) 1 g tablet, Take 1 tablet (1 g total) by mouth 2 (two) times daily., Disp: 20 tablet, Rfl: 0 .  SUMAtriptan (IMITREX) 100 MG tablet, Take 100 mg by mouth every 2 (two) hours as needed for migraine. May repeat in 2 hours if headache persists or recurs., Disp: , Rfl:  .  SUMAtriptan (IMITREX) 6 MG/0.5ML SOLN injection, Inject 6 mg into the skin every 2 (two) hours as needed for migraine or headache. May repeat in 2 hours if headache persists or recurs., Disp: , Rfl:  .  Turmeric 450 MG CAPS, Take by mouth., Disp: , Rfl:  .  vitamin B-12 (CYANOCOBALAMIN) 100 MCG tablet, Take 100 mcg by mouth daily., Disp: , Rfl:  .  budesonide (PULMICORT) 180 MCG/ACT inhaler, Inhale 2 puffs into the lungs 2 (two) times daily., Disp: 1 Inhaler, Rfl: 0 .  diazepam (VALIUM) 5 MG tablet, Take 1 tablet (5 mg total) by mouth every 12 (twelve)  hours as needed for muscle spasms., Disp: 5 tablet, Rfl: 0 .  HYDROcodone-acetaminophen (NORCO/VICODIN) 5-325 MG tablet, Take 2 tablets by mouth every 4 (four) hours as needed., Disp: 6 tablet, Rfl: 0 .  metroNIDAZOLE (FLAGYL) 500 MG tablet, Take 1 tablet (500 mg total) by mouth 2 (two) times daily after a meal., Disp: 14 tablet, Rfl: 0 .  predniSONE (DELTASONE) 20 MG tablet, Take 2 tablets (40 mg total) by mouth daily., Disp: 10 tablet, Rfl: 0 .  topiramate (TOPAMAX) 200 MG tablet, Take 200 mg by mouth 2 (two) times daily., Disp: , Rfl:   Allergies Aspirin, Augmentin [amoxicillin-pot clavulanate], and Tramadol  Family History  Problem Relation Age of Onset  . Breast  cancer Paternal Aunt   . Migraines Mother   . Cancer Father     Social History Social History   Tobacco Use  . Smoking status: Never Smoker  . Smokeless tobacco: Never Used  Substance Use Topics  . Alcohol use: No  . Drug use: No    Review of Systems Constitutional: No fever. ENT: No sore throat. Cardiovascular: Denies chest pain. Respiratory: Denies shortness of breath. Gastrointestinal: Positive abdominal pain.  No nausea, no vomiting.  No diarrhea.  No constipation. Genitourinary: Negative for dysuria. Musculoskeletal: Negative for back pain. Skin: Negative for rash.   ____________________________________________   PHYSICAL EXAM:  VITAL SIGNS: ED Triage Vitals  Enc Vitals Group     BP 12/14/18 1646 134/81     Pulse Rate 12/14/18 1646 76     Resp 12/14/18 1646 16     Temp 12/14/18 1646 98.3 F (36.8 C)     Temp Source 12/14/18 1646 Oral     SpO2 12/14/18 1646 100 %     Weight 12/14/18 1640 235 lb (106.6 kg)     Height 12/14/18 1640 5\' 5"  (1.651 m)     Head Circumference --      Peak Flow --      Pain Score 12/14/18 1640 0     Pain Loc --      Pain Edu? --      Excl. in GC? --     Constitutional: Alert and oriented. Well appearing and in no acute distress. Eyes: Conjunctivae are normal.  ENT      Head: Normocephalic and atraumatic. Cardiovascular: Normal rate, regular rhythm. Grossly normal heart sounds.  Good peripheral circulation. Respiratory: Normal respiratory effort without tachypnea nor retractions. Breath sounds are clear and equal bilaterally. No wheezes, rales, rhonchi. Gastrointestinal: Soft and nontender. No guarding. Normal Bowel sounds. No CVA tenderness. Musculoskeletal:  Steady gait.  Neurologic:  Normal speech and language. Speech is normal. No gait instability.  Skin:  Skin is warm, dry. Psychiatric: Mood and affect are normal. Speech and behavior are normal. Patient exhibits appropriate insight and judgment.    ___________________________________________   LABS (all labs ordered are listed, but only abnormal results are displayed)  Labs Reviewed  URINALYSIS, COMPLETE (UACMP) WITH MICROSCOPIC - Abnormal; Notable for the following components:      Result Value   Bacteria, UA RARE (*)    All other components within normal limits     PROCEDURES Procedures     INITIAL IMPRESSION / ASSESSMENT AND PLAN / ED COURSE  Pertinent labs & imaging results that were available during my care of the patient were reviewed by me and considered in my medical decision making (see chart for details).  Well-appearing patient.  No acute distress.  Abdomen soft nontender at this time.  Reports this is been ongoing with intermittent flareups without known trigger.  Well-appearing at this time.  Recommend follow-up with primary care if strongly encouraged following up with GI.  Information given and referral placed.  Supportive care monitoring.  Discussed follow up with Primary care physician this week. Discussed follow up and return parameters including no resolution or any worsening concerns. Patient verbalized understanding and agreed to plan.   ____________________________________________   FINAL CLINICAL IMPRESSION(S) / ED DIAGNOSES  Final diagnoses:  Abdominal pain, unspecified abdominal location     ED Discharge Orders         Ordered    Ambulatory referral to Gastroenterology     12/14/18 1708           Note: This dictation was prepared with Dragon dictation along with smaller phrase technology. Any transcriptional errors that result from this process are unintentional.         Renford DillsMiller, Timtohy Broski, NP 12/14/18 1827

## 2018-12-14 NOTE — ED Triage Notes (Signed)
Patient complains of abdominal pain that has been hurting x 1 month or more. Patient states that pain was worse over the weekend and she hasn't noticed much pain today. Pain is typically in left lower quadrant.

## 2018-12-23 ENCOUNTER — Ambulatory Visit: Payer: BC Managed Care – PPO | Admitting: Gastroenterology

## 2018-12-29 ENCOUNTER — Other Ambulatory Visit: Payer: Self-pay | Admitting: Family Medicine

## 2018-12-29 DIAGNOSIS — Z1231 Encounter for screening mammogram for malignant neoplasm of breast: Secondary | ICD-10-CM

## 2019-02-09 ENCOUNTER — Ambulatory Visit
Admission: RE | Admit: 2019-02-09 | Discharge: 2019-02-09 | Disposition: A | Discharge status: Home / Self Care | Payer: BC Managed Care – PPO | Source: Ambulatory Visit | Attending: Family Medicine | Admitting: Family Medicine

## 2019-02-09 DIAGNOSIS — Z1231 Encounter for screening mammogram for malignant neoplasm of breast: Secondary | ICD-10-CM | POA: Diagnosis present

## 2020-01-28 ENCOUNTER — Other Ambulatory Visit: Payer: Self-pay | Admitting: Family Medicine

## 2020-01-28 DIAGNOSIS — Z1231 Encounter for screening mammogram for malignant neoplasm of breast: Secondary | ICD-10-CM

## 2020-02-01 ENCOUNTER — Other Ambulatory Visit: Payer: Self-pay | Admitting: Family Medicine

## 2020-02-01 DIAGNOSIS — Z1231 Encounter for screening mammogram for malignant neoplasm of breast: Secondary | ICD-10-CM

## 2020-02-10 ENCOUNTER — Other Ambulatory Visit: Payer: Self-pay

## 2020-02-10 ENCOUNTER — Ambulatory Visit
Admission: RE | Admit: 2020-02-10 | Discharge: 2020-02-10 | Disposition: A | Discharge status: Home / Self Care | Payer: BC Managed Care – PPO | Source: Ambulatory Visit | Attending: Family Medicine | Admitting: Family Medicine

## 2020-02-10 DIAGNOSIS — Z1231 Encounter for screening mammogram for malignant neoplasm of breast: Secondary | ICD-10-CM | POA: Diagnosis present

## 2020-05-08 ENCOUNTER — Telehealth: Payer: BC Managed Care – PPO

## 2020-12-27 ENCOUNTER — Other Ambulatory Visit (HOSPITAL_COMMUNITY): Payer: Self-pay

## 2020-12-27 MED ORDER — FLUARIX QUADRIVALENT 0.5 ML IM SUSY
PREFILLED_SYRINGE | INTRAMUSCULAR | 0 refills | Status: AC
  Filled 2020-12-27: qty 0.5, 1d supply, fill #0

## 2021-03-09 ENCOUNTER — Other Ambulatory Visit: Payer: Self-pay | Admitting: Family Medicine

## 2021-03-09 DIAGNOSIS — Z1231 Encounter for screening mammogram for malignant neoplasm of breast: Secondary | ICD-10-CM

## 2021-04-26 ENCOUNTER — Ambulatory Visit
Admission: RE | Admit: 2021-04-26 | Discharge: 2021-04-26 | Disposition: A | Discharge status: Home / Self Care | Payer: BC Managed Care – PPO | Source: Ambulatory Visit | Attending: Family Medicine | Admitting: Family Medicine

## 2021-04-26 ENCOUNTER — Other Ambulatory Visit: Payer: Self-pay

## 2021-04-26 DIAGNOSIS — Z1231 Encounter for screening mammogram for malignant neoplasm of breast: Secondary | ICD-10-CM | POA: Diagnosis present

## 2021-08-06 ENCOUNTER — Emergency Department
Admission: EM | Admit: 2021-08-06 | Discharge: 2021-08-07 | Disposition: A | Discharge status: Home / Self Care | Payer: BC Managed Care – PPO | Attending: Emergency Medicine | Admitting: Emergency Medicine

## 2021-08-06 ENCOUNTER — Encounter: Payer: Self-pay | Admitting: *Deleted

## 2021-08-06 ENCOUNTER — Other Ambulatory Visit: Payer: Self-pay

## 2021-08-06 DIAGNOSIS — K529 Noninfective gastroenteritis and colitis, unspecified: Secondary | ICD-10-CM | POA: Diagnosis not present

## 2021-08-06 DIAGNOSIS — G43809 Other migraine, not intractable, without status migrainosus: Secondary | ICD-10-CM | POA: Diagnosis not present

## 2021-08-06 DIAGNOSIS — Z20822 Contact with and (suspected) exposure to covid-19: Secondary | ICD-10-CM | POA: Diagnosis not present

## 2021-08-06 DIAGNOSIS — R112 Nausea with vomiting, unspecified: Secondary | ICD-10-CM | POA: Diagnosis present

## 2021-08-06 LAB — CBC
HCT: 39.1 % (ref 36.0–46.0)
Hemoglobin: 13 g/dL (ref 12.0–15.0)
MCH: 30.4 pg (ref 26.0–34.0)
MCHC: 33.2 g/dL (ref 30.0–36.0)
MCV: 91.4 fL (ref 80.0–100.0)
Platelets: 211 10*3/uL (ref 150–400)
RBC: 4.28 MIL/uL (ref 3.87–5.11)
RDW: 14.3 % (ref 11.5–15.5)
WBC: 6.3 10*3/uL (ref 4.0–10.5)
nRBC: 0 % (ref 0.0–0.2)

## 2021-08-06 LAB — COMPREHENSIVE METABOLIC PANEL
ALT: 24 U/L (ref 0–44)
AST: 22 U/L (ref 15–41)
Albumin: 4.5 g/dL (ref 3.5–5.0)
Alkaline Phosphatase: 97 U/L (ref 38–126)
Anion gap: 13 (ref 5–15)
BUN: 17 mg/dL (ref 6–20)
CO2: 22 mmol/L (ref 22–32)
Calcium: 9.7 mg/dL (ref 8.9–10.3)
Chloride: 101 mmol/L (ref 98–111)
Creatinine, Ser: 0.7 mg/dL (ref 0.44–1.00)
GFR, Estimated: >60 mL/min (ref >60)
Glucose, Bld: 128 mg/dL — ABNORMAL HIGH (ref 70–99)
Potassium: 4.1 mmol/L (ref 3.5–5.1)
Sodium: 136 mmol/L (ref 135–145)
Total Bilirubin: 1.2 mg/dL (ref 0.3–1.2)
Total Protein: 8.1 g/dL (ref 6.5–8.1)

## 2021-08-06 LAB — SARS CORONAVIRUS 2 BY RT PCR: SARS Coronavirus 2 by RT PCR: NEGATIVE

## 2021-08-06 LAB — LIPASE, BLOOD: Lipase: 20 U/L (ref 11–51)

## 2021-08-06 MED ORDER — SODIUM CHLORIDE 0.9 % IV SOLN
1000.0000 mL | Freq: Once | INTRAVENOUS | Status: AC
  Administered 2021-08-06: 1000 mL via INTRAVENOUS

## 2021-08-06 MED ORDER — DIPHENHYDRAMINE HCL 50 MG/ML IJ SOLN
25.0000 mg | Freq: Once | INTRAMUSCULAR | Status: AC
  Administered 2021-08-06: 25 mg via INTRAVENOUS
  Filled 2021-08-06: qty 1

## 2021-08-06 MED ORDER — ONDANSETRON 4 MG PO TBDP: 4.0000 mg | ORAL_TABLET | Freq: Three times a day (TID) | ORAL | 0 refills | Status: AC | PRN

## 2021-08-06 MED ORDER — ONDANSETRON HCL 4 MG/2ML IJ SOLN
4.0000 mg | Freq: Once | INTRAMUSCULAR | Status: AC
Start: 2021-08-06 — End: 2021-08-06
  Administered 2021-08-06: 4 mg via INTRAVENOUS
  Filled 2021-08-06: qty 2

## 2021-08-06 MED ORDER — KETOROLAC TROMETHAMINE 30 MG/ML IJ SOLN
30.0000 mg | Freq: Once | INTRAMUSCULAR | Status: AC
Start: 2021-08-06 — End: 2021-08-06
  Administered 2021-08-06: 30 mg via INTRAVENOUS
  Filled 2021-08-06: qty 1

## 2021-08-06 MED ORDER — METOCLOPRAMIDE HCL 5 MG/ML IJ SOLN
20.0000 mg | Freq: Once | INTRAVENOUS | Status: AC
  Administered 2021-08-06: 20 mg via INTRAVENOUS
  Filled 2021-08-06: qty 4

## 2021-08-06 NOTE — ED Provider Notes (Signed)
Patient reassessed after migraine cocktail, feeling markedly improved and requesting to be discharged.  Tolerating p.o.  COVID test is negative.  Discussed results and return precautions with patient.   Don Perking, [place], MD 08/06/21 [phone]

## 2021-08-06 NOTE — ED Notes (Signed)
Reglan requested from pharmacy.

## 2021-08-06 NOTE — ED Triage Notes (Signed)
Pt reports abd pain with vomiting and diarrhea since 3am today.  Pt also has a headache.  Hx migraines.  Pt alert  speech clear.

## 2021-08-06 NOTE — ED Provider Notes (Signed)
[facility] Provider Note    Event Date/Time   First MD Initiated Contact with Patient 08/06/21 2058     (approximate)   History   Abdominal Pain   HPI  [name] is a 56 y.o. female with a history of migraine headaches presents with complaints of nausea vomiting and diarrhea since 3 AM this morning.  Patient reports she has been having repeated episodes of diarrhea and vomiting.  She denies abdominal pain.  No sick contacts reported.  Denies fevers or chills.     Physical Exam   Triage Vital Signs: ED Triage Vitals  Enc Vitals Group     BP 08/06/21 2026 137/87     Pulse Rate 08/06/21 2026 (!) 105     Resp 08/06/21 2026 20     Temp 08/06/21 2026 98.5 F (36.9 C)     Temp Source 08/06/21 2026 Oral     SpO2 08/06/21 2026 96 %     Weight 08/06/21 2029 112 kg (247 lb)     Height 08/06/21 2029 1.651 m (5\' 5" )     Head Circumference --      Peak Flow --      Pain Score 08/06/21 2029 8     Pain Loc --      Pain Edu? --      Excl. in GC? --     Most recent vital signs: Vitals:   08/06/21 2026 08/06/21 2238  BP: 137/87 (!) 124/54  Pulse: (!) 105 79  Resp: 20 12  Temp: 98.5 F (36.9 C)   SpO2: 96% 96%     General: Awake, no distress.  CV:  Good peripheral perfusion.  Resp:  Normal effort.  Abd:  No distention.  Soft, nontender Other:     ED Results / Procedures / Treatments   Labs (all labs ordered are listed, but only abnormal results are displayed) Labs Reviewed  COMPREHENSIVE METABOLIC PANEL - Abnormal; Notable for the following components:      Result Value   Glucose, Bld 128 (*)    All other components within normal limits  SARS CORONAVIRUS 2 BY RT PCR  LIPASE, BLOOD  CBC     EKG     RADIOLOGY     PROCEDURES:  Critical Care performed:   Procedures   MEDICATIONS ORDERED IN ED: Medications  metoCLOPramide (REGLAN) 20 mg in dextrose 5 % 50 mL IVPB (20 mg Intravenous New Bag/Given 08/06/21 2243)  0.9 %   sodium chloride infusion (1,000 mLs Intravenous New Bag/Given 08/06/21 2134)  ketorolac (TORADOL) 30 MG/ML injection 30 mg (30 mg Intravenous Given 08/06/21 2133)  ondansetron (ZOFRAN) injection 4 mg (4 mg Intravenous Given 08/06/21 2134)  diphenhydrAMINE (BENADRYL) injection 25 mg (25 mg Intravenous Given 08/06/21 2234)     IMPRESSION / MDM / ASSESSMENT AND PLAN / ED COURSE  I reviewed the triage vital signs and the nursing notes. Patient's presentation is most consistent with acute illness / injury with system symptoms.   Patient presents with nausea vomiting diarrhea as detailed above.  Differential includes viral gastroenteritis, foodborne illness, gastritis  No abdominal tenderness palpation.  Lab work reviewed, CMP lipase CBC are unremarkable.  Will give IV fluids, IV Zofran and reevaluate  Patient is feeling somewhat better, she does complain of headache which she attributes to her typical migraine.  Given Reglan and Benadryl for this with improvement  Considered admission however patient symptoms have improved, no further vomiting, headache improved, appropriate for discharge  with outpatient follow-up as needed, return precautions discussed      FINAL CLINICAL IMPRESSION(S) / ED DIAGNOSES   Final diagnoses:  Gastroenteritis  Other migraine without status migrainosus, not intractable     Rx / DC Orders   ED Discharge Orders     None        Note:  This document was prepared using Dragon voice recognition software and may include unintentional dictation errors.   Jene Every, MD 08/06/21 [phone]

## 2021-08-31 ENCOUNTER — Other Ambulatory Visit: Payer: Self-pay | Admitting: Family Medicine

## 2021-08-31 DIAGNOSIS — R1032 Left lower quadrant pain: Secondary | ICD-10-CM

## 2021-12-18 ENCOUNTER — Other Ambulatory Visit (HOSPITAL_BASED_OUTPATIENT_CLINIC_OR_DEPARTMENT_OTHER): Payer: Self-pay

## 2021-12-21 ENCOUNTER — Other Ambulatory Visit (HOSPITAL_BASED_OUTPATIENT_CLINIC_OR_DEPARTMENT_OTHER): Payer: Self-pay

## 2021-12-21 MED ORDER — FLUARIX QUADRIVALENT 0.5 ML IM SUSY
PREFILLED_SYRINGE | INTRAMUSCULAR | 0 refills | Status: AC
  Filled 2021-12-21: qty 0.5, 1d supply, fill #0

## 2022-04-03 ENCOUNTER — Other Ambulatory Visit: Payer: Self-pay | Admitting: Family Medicine

## 2022-04-03 DIAGNOSIS — Z1231 Encounter for screening mammogram for malignant neoplasm of breast: Secondary | ICD-10-CM

## 2022-04-17 ENCOUNTER — Other Ambulatory Visit: Payer: Self-pay | Admitting: Family Medicine

## 2022-04-17 DIAGNOSIS — N939 Abnormal uterine and vaginal bleeding, unspecified: Secondary | ICD-10-CM

## 2022-04-29 ENCOUNTER — Ambulatory Visit
Admission: RE | Admit: 2022-04-29 | Discharge: 2022-04-29 | Disposition: A | Discharge status: Home / Self Care | Payer: BC Managed Care – PPO | Source: Ambulatory Visit | Attending: Family Medicine | Admitting: Family Medicine

## 2022-04-29 DIAGNOSIS — Z1231 Encounter for screening mammogram for malignant neoplasm of breast: Secondary | ICD-10-CM | POA: Diagnosis not present

## 2022-05-03 ENCOUNTER — Ambulatory Visit
Admission: RE | Admit: 2022-05-03 | Discharge: 2022-05-03 | Disposition: A | Discharge status: Home / Self Care | Payer: BC Managed Care – PPO | Source: Ambulatory Visit | Attending: Family Medicine | Admitting: Family Medicine

## 2022-05-03 DIAGNOSIS — N939 Abnormal uterine and vaginal bleeding, unspecified: Secondary | ICD-10-CM | POA: Diagnosis present

## 2022-05-22 ENCOUNTER — Other Ambulatory Visit (HOSPITAL_COMMUNITY)
Admission: RE | Admit: 2022-05-22 | Discharge: 2022-05-22 | Disposition: A | Discharge status: Home / Self Care | Payer: BC Managed Care – PPO | Source: Ambulatory Visit | Attending: Certified Nurse Midwife | Admitting: Certified Nurse Midwife

## 2022-05-22 ENCOUNTER — Ambulatory Visit (INDEPENDENT_AMBULATORY_CARE_PROVIDER_SITE_OTHER): Payer: BC Managed Care – PPO | Admitting: Certified Nurse Midwife

## 2022-05-22 ENCOUNTER — Encounter: Payer: Self-pay | Admitting: Certified Nurse Midwife

## 2022-05-22 VITALS — BP 136/84 | HR 78 | Resp 15 | Ht 65.0 in | Wt 257.7 lb

## 2022-05-22 DIAGNOSIS — N926 Irregular menstruation, unspecified: Secondary | ICD-10-CM

## 2022-05-22 DIAGNOSIS — N939 Abnormal uterine and vaginal bleeding, unspecified: Secondary | ICD-10-CM

## 2022-05-22 NOTE — Progress Notes (Signed)
GYN ENCOUNTER NOTE  Subjective:       [name] is a 57 y.o. No obstetric history on file. female is here for gynecologic evaluation of the following issues:  1. Referral for vaginal spotting. Pt state she has never stopped bleeding that she went from having normal periods to irregular spotting that started a 2-3 yrs ago. She states that the spotting is more often that not. She denies pain . She denies STD states she has not been sexually active for years.      Gynecologic History No LMP recorded. (Menstrual status: Perimenopausal). Contraception: none Last Pap: about 3 yrs ago. Results were: normal per pt. She denies history of abnormal pap   Obstetric History OB History  No obstetric history on file.    Past Medical History:  Diagnosis Date   Anemia    Arthritis    Asthma    Bursitis    Migraine    Multiple gastric ulcers    Plantar fasciitis     Past Surgical History:  Procedure Laterality Date   CARPAL TUNNEL RELEASE Right    TUBAL LIGATION      Current Outpatient Medications on File Prior to Visit  Medication Sig Dispense Refill   albuterol (PROVENTIL HFA;VENTOLIN HFA) 108 (90 BASE) MCG/ACT inhaler Inhale 2 puffs into the lungs.     Apple Cider Vinegar 300 MG TABS Take by mouth.     baclofen (LIORESAL) 10 MG tablet Take by mouth.     budesonide (PULMICORT) 180 MCG/ACT inhaler Inhale 2 puffs into the lungs 2 (two) times daily. 1 Inhaler 0   Cinnamon 500 MG TABS Take by mouth.     diazepam (VALIUM) 5 MG tablet Take 1 tablet (5 mg total) by mouth every 12 (twelve) hours as needed for muscle spasms. 5 tablet 0   fluticasone-salmeterol (ADVAIR HFA) 115-21 MCG/ACT inhaler Inhale 2 puffs into the lungs 2 (two) times daily.     HYDROcodone-acetaminophen (NORCO/VICODIN) 5-325 MG tablet Take 2 tablets by mouth every 4 (four) hours as needed. 6 tablet 0   influenza vac split quadrivalent PF (FLUARIX QUADRIVALENT) 0.5 ML injection Inject into the muscle. 0.5 mL 0    influenza vac split quadrivalent PF (FLUARIX QUADRIVALENT) 0.5 ML injection Inject into the muscle. 0.5 mL 0   lactobacillus acidophilus (BACID) TABS tablet Take 2 tablets by mouth 3 (three) times daily.     magnesium 30 MG tablet Take 30 mg by mouth 2 (two) times daily.     medium chain triglycerides (MCT OIL) oil Take by mouth 3 (three) times daily.     metroNIDAZOLE (FLAGYL) 500 MG tablet Take 1 tablet (500 mg total) by mouth 2 (two) times daily after a meal. 14 tablet 0   Multiple Vitamins-Minerals (MULTIVITAMIN WITH MINERALS) tablet Take 1 tablet by mouth daily.     ondansetron (ZOFRAN-ODT) 4 MG disintegrating tablet Take 1 tablet (4 mg total) by mouth every 8 (eight) hours as needed for nausea or vomiting. 20 tablet 0   pantoprazole (PROTONIX) 40 MG tablet      predniSONE (DELTASONE) 20 MG tablet Take 2 tablets (40 mg total) by mouth daily. 10 tablet 0   sucralfate (CARAFATE) 1 g tablet Take 1 tablet (1 g total) by mouth 2 (two) times daily. 20 tablet 0   SUMAtriptan (IMITREX) 100 MG tablet Take 100 mg by mouth every 2 (two) hours as needed for migraine. May repeat in 2 hours if headache persists or recurs.  SUMAtriptan (IMITREX) 6 MG/0.5ML SOLN injection Inject 6 mg into the skin every 2 (two) hours as needed for migraine or headache. May repeat in 2 hours if headache persists or recurs.     topiramate (TOPAMAX) 200 MG tablet Take 200 mg by mouth 2 (two) times daily.     Turmeric 450 MG CAPS Take by mouth.     vitamin B-12 (CYANOCOBALAMIN) 100 MCG tablet Take 100 mcg by mouth daily.     No current facility-administered medications on file prior to visit.    Allergies  Allergen Reactions   Aspirin    Augmentin [Amoxicillin-Pot Clavulanate] Rash   Tramadol Rash    Social History   Socioeconomic History   Marital status: Single    Spouse name: Not on file   Number of children: Not on file   Years of education: Not on file   Highest education level: Not on file  Occupational  History   Not on file  Tobacco Use   Smoking status: Never   Smokeless tobacco: Never  Vaping Use   Vaping Use: Never used  Substance and Sexual Activity   Alcohol use: No   Drug use: No   Sexual activity: Yes  Other Topics Concern   Not on file  Social History Narrative   Not on file   Social Determinants of Health   Financial Resource Strain: Not on file  Food Insecurity: Not on file  Transportation Needs: Not on file  Physical Activity: Not on file  Stress: Not on file  Social Connections: Not on file  Intimate Partner Violence: Not on file    Family History  Problem Relation Age of Onset   Breast cancer Paternal 52    Migraines Mother    Cancer Father     The following portions of the patient's history were reviewed and updated as appropriate: allergies, current medications, past family history, past medical history, past social history, past surgical history and problem list.  Review of Systems Review of Systems - Negative except as mentioned in HPI Review of Systems - General ROS: negative for - chills, fatigue, fever, hot flashes, malaise or night sweats Hematological and Lymphatic ROS: negative for - bleeding problems or swollen lymph nodes Gastrointestinal ROS: negative for - abdominal pain, blood in stools, change in bowel habits and nausea/vomiting Musculoskeletal ROS: negative for - joint pain, muscle pain or muscular weakness Genito-Urinary ROS: negative for - change in menstrual cycle, dysmenorrhea, dyspareunia, dysuria, genital discharge, genital ulcers, hematuria, incontinence, irregular/heavy menses, nocturia or pelvic pain. Positive for vaginal bleeding-spotting.   Objective:   BP 136/84   Pulse 78   Resp 15   Ht '5\' 5"'$  (1.651 m)   Wt 257 lb 11.2 oz (116.9 kg)   BMI 42.88 kg/m  CONSTITUTIONAL: Well-developed, well-nourished female in no acute distress.  HENT:  Normocephalic, atraumatic.  NECK: Normal range of motion, supple, no masses.  Normal  thyroid.  SKIN: Skin is warm and dry. No rash noted. Not diaphoretic. No erythema. No pallor. [place]: Alert and oriented to person, place, and time. PSYCHIATRIC: Normal mood and affect. Normal behavior. Normal judgment and thought content. CARDIOVASCULAR:Not Examined RESPIRATORY: Not Examined BREASTS: Not Examined ABDOMEN: Soft, non distended; Non tender.  No Organomegaly. PELVIC:  External Genitalia: Normal  BUS: Normal  Vagina: Normal  Cervix: Normal, no polyps seen , pap collected  Uterus: Normal size, shape,consistency, mobile  Adnexa: Normal  RV: Normal   Bladder: Nontender MUSCULOSKELETAL: Normal range of motion. No tenderness.  No cyanosis, clubbing, or edema.   Assessment:   Narrative & Impression  CLINICAL DATA:  Vaginal spotting for a year. The patient reports she has never gone a year without a menstrual cycle.   EXAM: TRANSABDOMINAL AND TRANSVAGINAL ULTRASOUND OF PELVIS   TECHNIQUE: Both transabdominal and transvaginal ultrasound examinations of the pelvis were performed. Transabdominal technique was performed for global imaging of the pelvis including uterus, ovaries, adnexal regions, and pelvic cul-de-sac. It was necessary to proceed with endovaginal exam following the transabdominal exam to visualize the uterus and adnexa.   COMPARISON:  CT abdomen pelvis dated 12/11/2014.   FINDINGS: Uterus   Measurements: 8.3 x 2.8 x 4.4 cm = volume: 55 mL. No fibroids or other mass visualized.   Endometrium   Thickness: 10 mm.  No focal abnormality visualized.   Right ovary   Not visualized.   Left ovary   Not visualized.   Other findings   No abnormal free fluid.   IMPRESSION: Endometrium measures 10 mm. In the setting of post-menopausal bleeding, endometrial sampling is indicated to exclude carcinoma. If results are benign, sonohysterogram should be considered for focal lesion work-up. (Ref: Radiological Reasoning: Algorithmic Workup of Abnormal  Vaginal Bleeding with Endovaginal Sonography and Sonohysterography. AJR [id number])     Electronically Signed   By: Zerita Boers M.D.   On: 05/03/2022 17:04     Abnormal uterine bleeding   Plan:   Discussed possible cause of spotting including infection, swab and pap collected. Discussed further evaluation of endometrial biopsy given recent u/s result with endometrial thickness. She is in agreement , verbal consent obtained.  Will follow up with results of pap , swab , and biopsy.   Philip Aspen, CNM

## 2022-05-22 NOTE — Addendum Note (Signed)
Addended by: Hildred Priest on: 05/22/2022 12:11 PM   Modules accepted: Orders

## 2022-05-22 NOTE — Progress Notes (Signed)
Endometrial Biopsy Procedure Note  Pre-operative Diagnosis: Endometiral thickness 10 mm , abnormal uterine bleeding  Post-operative Diagnosis: same  Indications: abnormal uterine bleeding  Procedure Details   Urine pregnancy test was not done.  The risks (including infection, bleeding, pain, and uterine perforation) and benefits of the procedure were explained to the patient and Verbal informed consent was obtained.  Antibiotic prophylaxis against endocarditis was not indicated.   The patient was placed in the dorsal lithotomy position.  Bimanual exam showed the uterus to be in the neutral position.  A Graves' speculum inserted in the vagina, and the cervix prepped with povidone iodine.  Endocervical curettage with a Kevorkian curette was not performed.   A sharp tenaculum was applied to the anterior lip of the cervix for stabilization.  A sterile uterine sound was used to sound the uterus to a depth of 8cm.  A Pipelle endometrial aspirator was used to sample the endometrium.  Sample was sent for pathologic examination.  Condition: Stable  Complications: None  Plan:  The patient was advised to call for any fever or for prolonged or severe pain or bleeding. She was advised to use OTC acetaminophen as needed for mild to moderate pain. She was advised to avoid vaginal intercourse for 48 hours or until the bleeding has completely stopped.  Philip Aspen, CNM

## 2022-05-23 LAB — FOLLICLE STIMULATING HORMONE: FSH: 31.3 m[IU]/mL

## 2022-05-24 LAB — CERVICOVAGINAL ANCILLARY ONLY
Bacterial Vaginitis (gardnerella): NEGATIVE
Candida Glabrata: NEGATIVE
Candida Vaginitis: NEGATIVE
Comment: NEGATIVE
Comment: NEGATIVE
Comment: NEGATIVE

## 2022-05-24 LAB — SURGICAL PATHOLOGY

## 2022-05-27 LAB — CYTOLOGY - PAP
Comment: NEGATIVE
Diagnosis: NEGATIVE
High risk HPV: NEGATIVE

## 2022-09-20 ENCOUNTER — Other Ambulatory Visit: Payer: Self-pay

## 2022-09-20 ENCOUNTER — Emergency Department
Admission: EM | Admit: 2022-09-20 | Discharge: 2022-09-20 | Disposition: A | Discharge status: Home / Self Care | Payer: PRIVATE HEALTH INSURANCE | Attending: Emergency Medicine | Admitting: Emergency Medicine

## 2022-09-20 ENCOUNTER — Emergency Department: Payer: PRIVATE HEALTH INSURANCE

## 2022-09-20 DIAGNOSIS — J209 Acute bronchitis, unspecified: Secondary | ICD-10-CM | POA: Diagnosis not present

## 2022-09-20 DIAGNOSIS — R059 Cough, unspecified: Secondary | ICD-10-CM | POA: Diagnosis present

## 2022-09-20 DIAGNOSIS — J45909 Unspecified asthma, uncomplicated: Secondary | ICD-10-CM | POA: Diagnosis not present

## 2022-09-20 LAB — CBC
HCT: 36.3 % (ref 36.0–46.0)
Hemoglobin: 11.9 g/dL — ABNORMAL LOW (ref 12.0–15.0)
MCH: 30.5 pg (ref 26.0–34.0)
MCHC: 32.8 g/dL (ref 30.0–36.0)
MCV: 93.1 fL (ref 80.0–100.0)
Platelets: 149 10*3/uL — ABNORMAL LOW (ref 150–400)
RBC: 3.9 MIL/uL (ref 3.87–5.11)
RDW: 14.3 % (ref 11.5–15.5)
WBC: 6.4 10*3/uL (ref 4.0–10.5)
nRBC: 0 % (ref 0.0–0.2)

## 2022-09-20 LAB — BASIC METABOLIC PANEL
Anion gap: 8 (ref 5–15)
BUN: 17 mg/dL (ref 6–20)
CO2: 23 mmol/L (ref 22–32)
Calcium: 9.2 mg/dL (ref 8.9–10.3)
Chloride: 107 mmol/L (ref 98–111)
Creatinine, Ser: 0.9 mg/dL (ref 0.44–1.00)
GFR, Estimated: >60 mL/min (ref >60)
Glucose, Bld: 179 mg/dL — ABNORMAL HIGH (ref 70–99)
Potassium: 4.2 mmol/L (ref 3.5–5.1)
Sodium: 138 mmol/L (ref 135–145)

## 2022-09-20 LAB — TROPONIN I (HIGH SENSITIVITY): Troponin I (High Sensitivity): 3 ng/L (ref <18)

## 2022-09-20 MED ORDER — PREDNISONE 20 MG PO TABS: 60.0000 mg | ORAL_TABLET | Freq: Every day | ORAL | 0 refills | Status: AC

## 2022-09-20 MED ORDER — PSEUDOEPHEDRINE HCL 30 MG PO TABS
60.0000 mg | ORAL_TABLET | Freq: Once | ORAL | Status: AC
  Administered 2022-09-20: 60 mg via ORAL
  Filled 2022-09-20: qty 2

## 2022-09-20 MED ORDER — PSEUDOEPHEDRINE HCL 60 MG PO TABS: 60.0000 mg | ORAL_TABLET | Freq: Four times a day (QID) | ORAL | 0 refills | Status: AC | PRN

## 2022-09-20 MED ORDER — ALBUTEROL SULFATE (2.5 MG/3ML) 0.083% IN NEBU
2.5000 mg | INHALATION_SOLUTION | Freq: Once | RESPIRATORY_TRACT | Status: AC
  Administered 2022-09-20: 2.5 mg via RESPIRATORY_TRACT
  Filled 2022-09-20: qty 3

## 2022-09-20 MED ORDER — AZITHROMYCIN 250 MG PO TABS: ORAL_TABLET | ORAL | 0 refills | Status: AC

## 2022-09-20 MED ORDER — GUAIFENESIN-CODEINE 100-10 MG/5ML PO SOLN: 10.0000 mL | Freq: Every evening | ORAL | 0 refills | Status: AC | PRN

## 2022-09-20 MED ORDER — PREDNISONE 20 MG PO TABS
60.0000 mg | ORAL_TABLET | Freq: Once | ORAL | Status: AC
  Administered 2022-09-20: 60 mg via ORAL
  Filled 2022-09-20: qty 3

## 2022-09-20 NOTE — ED Triage Notes (Signed)
Pt here with and head congestion. Pt states pain is centered and does not radiate. Pt states pain has eased up but was worse last night. Pt states at times its hard her breath and it worse at night. Pt states at times it feels like her heart is fluttering. Pt denies NVD.

## 2022-09-20 NOTE — Discharge Instructions (Signed)
Take the prednisone as prescribed starting tomorrow.  Continue using your albuterol up to every 4 hours as needed for shortness of breath and her Advair as prescribed.  Use the pseudoephedrine up to every 6 hours as needed for congestion.  You may use the guaifenesin codeine at night to help with the cough and sleep.  We have prescribed azithromycin (an antibiotic).  However, you should take this only if your symptoms do not significantly improve in the next 2 days.  Return to the ER for new, worsening, or persistent severe shortness of breath, chest pain, weakness or lightheadedness, high fever, vomiting, or any other new or worsening symptoms that concerning.

## 2022-09-20 NOTE — ED Triage Notes (Signed)
Brought over from [facility]. Substernal chest pain and sob X4 days.

## 2022-09-20 NOTE — ED Provider Notes (Signed)
[facility] Provider Note    Event Date/Time   First MD Initiated Contact with Patient 09/20/22 1144     (approximate)   History   Chest Pain   HPI  [name] is a 57 y.o. female with a history of anemia, arthritis, asthma, migraine, PUD, and plantar fasciitis who presents with cough, shortness of breath, and chest tightness over the last 2 to 3 days.  The patient states that she has had a cough productive of clear phlegm that does not seem like it wants to come up.  She states she has been taking albuterol and Advair with some relief.  She reports that she started to have nasal congestion, rhinorrhea, and sinus pressure today.  She denies any vomiting or diarrhea.  She has no abdominal pain.  She denies any urinary symptoms.  I reviewed the past medical records.  The patient's most recent outpatient encounter was with OB/GYN on 3/13 for evaluation of vaginal spotting.  She has no recent hospitalizations.   Physical Exam   Triage Vital Signs: ED Triage Vitals  Encounter Vitals Group     BP 09/20/22 1031 (!) 149/76     Systolic BP Percentile --      Diastolic BP Percentile --      Pulse Rate 09/20/22 1031 80     Resp 09/20/22 1031 18     Temp 09/20/22 1033 97.8 F (36.6 C)     Temp Source 09/20/22 1031 Oral     SpO2 09/20/22 1031 100 %     Weight 09/20/22 1033 257 lb 11.5 oz (116.9 kg)     Height 09/20/22 1033 5\' 5"  (1.651 m)     Head Circumference --      Peak Flow --      Pain Score 09/20/22 1032 5     Pain Loc --      Pain Education --      Exclude from Growth Chart --     Most recent vital signs: Vitals:   09/20/22 1031 09/20/22 1033  BP: (!) 149/76   Pulse: 80   Resp: 18   Temp:  97.8 F (36.6 C)  SpO2: 100%      General: Awake, no distress.  CV:  Good peripheral perfusion.  Resp:  Normal effort.  Lungs CTAB.  Slightly prolonged expiratory phase. Abd:  No distention.  Other:  Oropharynx clear.  Nasal congestion.   ED  Results / Procedures / Treatments   Labs (all labs ordered are listed, but only abnormal results are displayed) Labs Reviewed  BASIC METABOLIC PANEL - Abnormal; Notable for the following components:      Result Value   Glucose, Bld 179 (*)    All other components within normal limits  CBC - Abnormal; Notable for the following components:   Hemoglobin 11.9 (*)    Platelets 149 (*)    All other components within normal limits  POC URINE PREG, ED  TROPONIN I (HIGH SENSITIVITY)     EKG  ED ECG REPORT I, Dionne Bucy, the attending physician, personally viewed and interpreted this ECG.  Date: 09/20/2022 EKG Time: 1029 Rate: 83 Rhythm: normal sinus rhythm QRS Axis: normal Intervals: normal ST/T Wave abnormalities: normal Narrative Interpretation: no evidence of acute ischemia    RADIOLOGY  Chest x-ray: I independently reviewed and interpreted the images; there is no focal consolidation or edema  PROCEDURES:  Critical Care performed: No  Procedures   MEDICATIONS ORDERED IN ED: Medications  albuterol (PROVENTIL) (2.5 MG/3ML) 0.083% nebulizer solution 2.5 mg (2.5 mg Nebulization Given 09/20/22 1251)  predniSONE (DELTASONE) tablet 60 mg (60 mg Oral Given 09/20/22 1249)  pseudoephedrine (SUDAFED) tablet 60 mg (60 mg Oral Given 09/20/22 1250)     IMPRESSION / MDM / ASSESSMENT AND PLAN / ED COURSE  I reviewed the triage vital signs and the nursing notes.  57 year old female with PMH as noted above presents with productive cough, shortness of breath, and chest tightness over the last several days which feels like her asthma, and then developed nasal congestion and rhinorrhea today.  On exam she is overall well-appearing with no respiratory distress.  O2 saturation is 100% on room air.  She has a slightly prolonged expiratory phase with no wheezing.  Differential diagnosis includes, but is not limited to, acute bronchitis, viral syndrome, pneumonia, less likely cardiac  etiology.  Patient's presentation is most consistent with acute complicated illness / injury requiring diagnostic workup.  Chest x-ray shows no significant abnormality.  Troponin is negative.  Given the duration of the symptoms and the nonischemic EKG, there is no indication for repeat.  CBC shows no leukocytosis.  Overall presentation based on this workup favors acute bronchitis.  I have ordered albuterol, Sudafed, and steroid.   ----------------------------------------- 1:27 PM on 09/20/2022 -----------------------------------------  Patient is feeling significantly better after albuterol and the oral medications.  She is stable for discharge home at this time.  I counseled her on the results of the workup.  At this time I do not see a strong indication for antibiotics as there is no evidence of pneumonia.  However based on discussion with the patient I have prescribed a Z-Pak.  I instructed her to only start taking it in 2 days if she does not have any improvement in her symptoms.  I gave strict return precautions and the patient expresses understanding.  FINAL CLINICAL IMPRESSION(S) / ED DIAGNOSES   Final diagnoses:  Acute bronchitis, unspecified organism     Rx / DC Orders   ED Discharge Orders          Ordered    azithromycin (ZITHROMAX Z-PAK) 250 MG tablet        09/20/22 1326    predniSONE (DELTASONE) 20 MG tablet  Daily        09/20/22 1326    pseudoephedrine (SUDAFED) 60 MG tablet  Every 6 hours PRN        09/20/22 1326    guaiFENesin-codeine 100-10 MG/5ML syrup  At bedtime PRN        09/20/22 1326             Note:  This document was prepared using Dragon voice recognition software and may include unintentional dictation errors.    Dionne Bucy, MD 09/20/22 1328

## 2022-09-20 NOTE — ED Notes (Signed)
See triage notes. Patient stated she started having chest pains and shortness of breath on Tuesday. Stated the symptoms have been getting worse and she now has a cough. Stated she was swabbed at [facility].

## 2023-06-10 ENCOUNTER — Other Ambulatory Visit: Payer: Self-pay | Admitting: Family Medicine

## 2023-06-10 DIAGNOSIS — Z1231 Encounter for screening mammogram for malignant neoplasm of breast: Secondary | ICD-10-CM

## 2023-06-23 ENCOUNTER — Ambulatory Visit
Admission: RE | Admit: 2023-06-23 | Discharge: 2023-06-23 | Disposition: A | Discharge status: Home / Self Care | Payer: PRIVATE HEALTH INSURANCE | Source: Ambulatory Visit | Attending: Family Medicine | Admitting: Family Medicine

## 2023-06-23 DIAGNOSIS — Z1231 Encounter for screening mammogram for malignant neoplasm of breast: Secondary | ICD-10-CM | POA: Insufficient documentation

## 2023-11-15 IMAGING — MG MM DIGITAL SCREENING BILAT W/ TOMO AND CAD
8 series · 8 of 24 positions shown · non-contrast
Comparison: Previous exam(s).

CLINICAL DATA: Screening.

EXAM:
DIGITAL SCREENING BILATERAL MAMMOGRAM WITH TOMOSYNTHESIS AND CAD
TECHNIQUE: Bilateral screening digital craniocaudal and mediolateral oblique
mammograms were obtained. Bilateral screening digital breast
tomosynthesis was performed. The images were evaluated with
computer-aided detection.

[R MLO synth-2D]
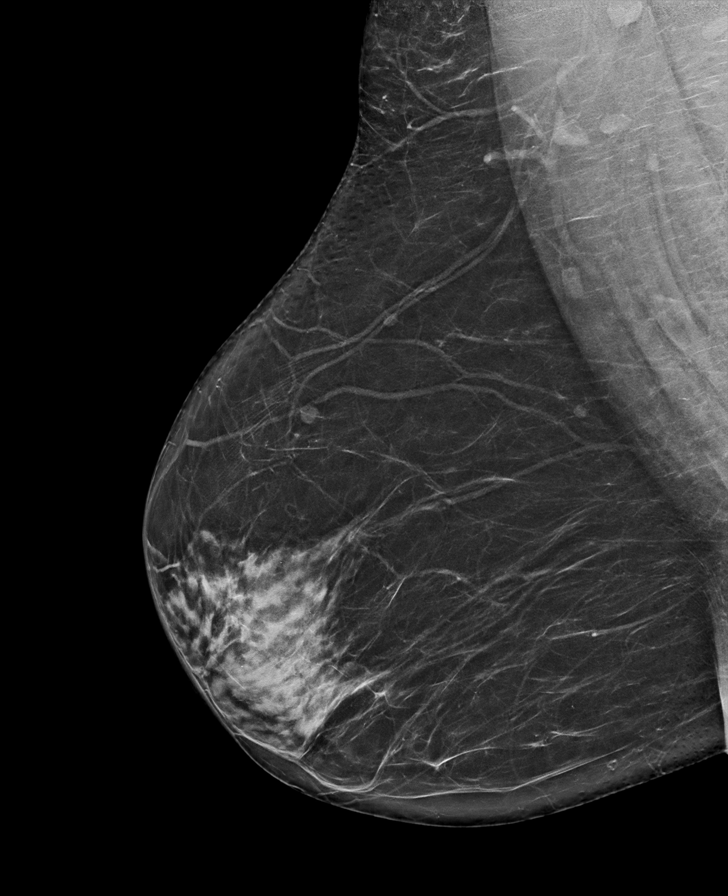

[L CC synth-2D]
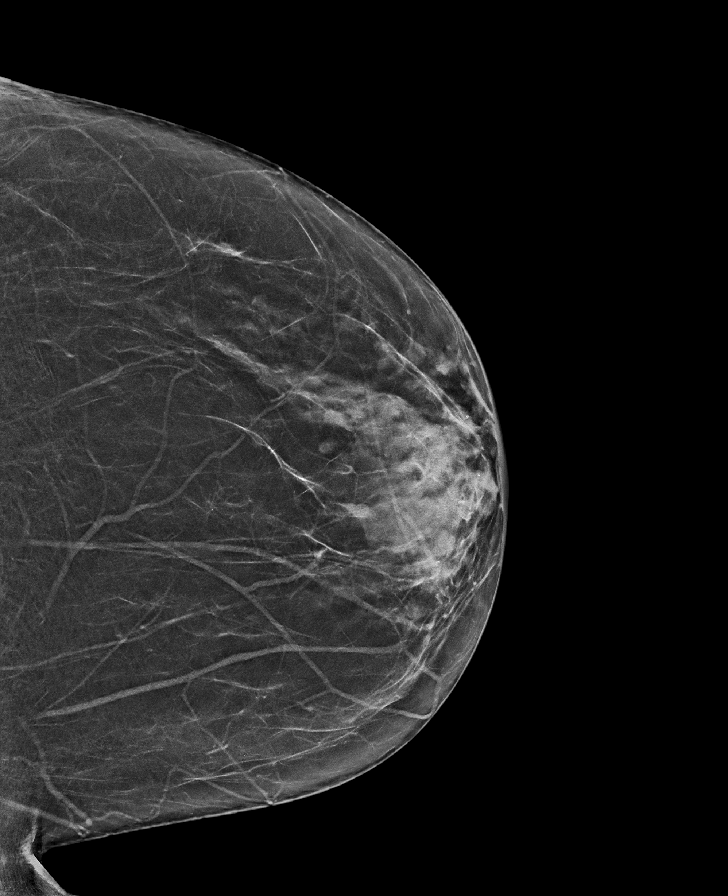

[L MLO synth-2D]
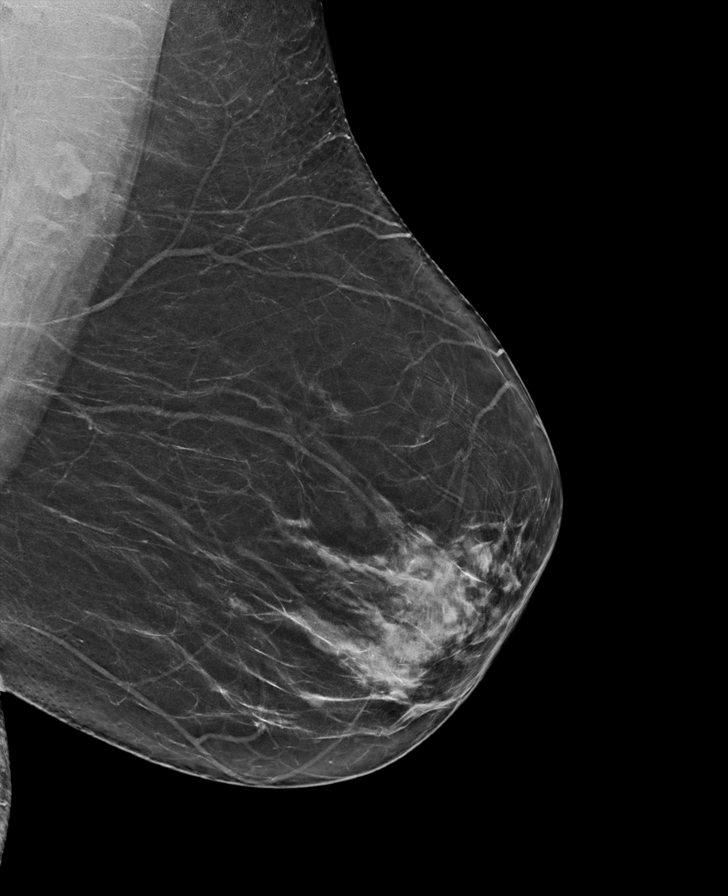

[R CC synth-2D]
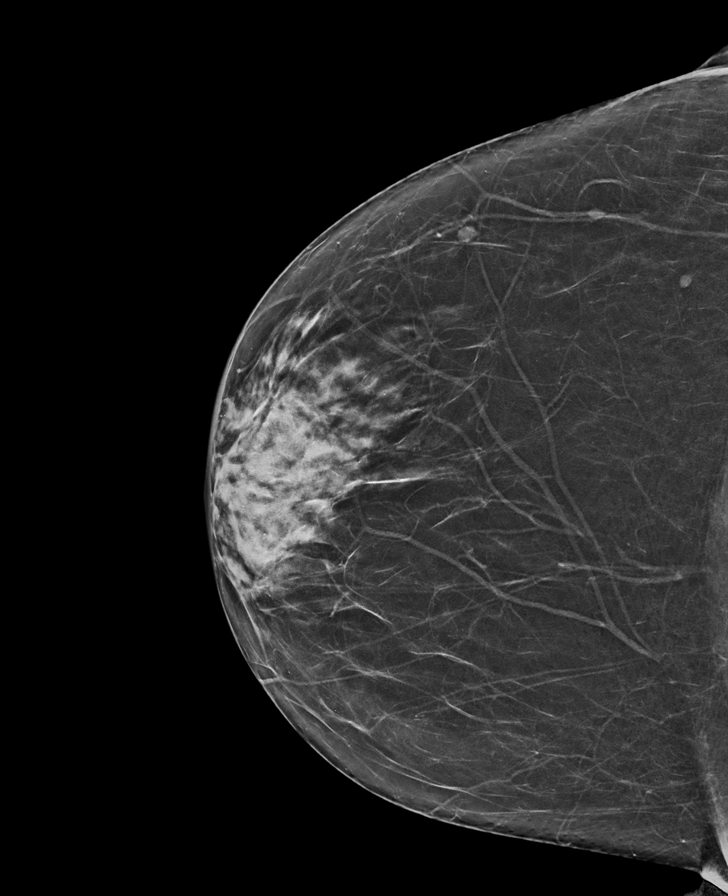

[L CC tomo · tomo slice 33/65.0]
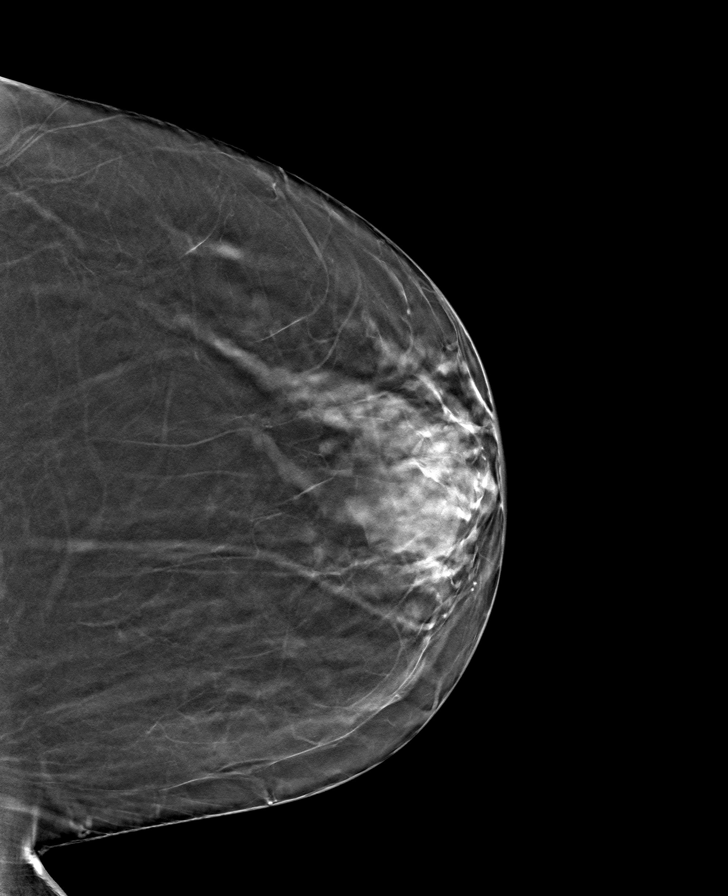

[R MLO tomo · tomo slice 41/80.0]
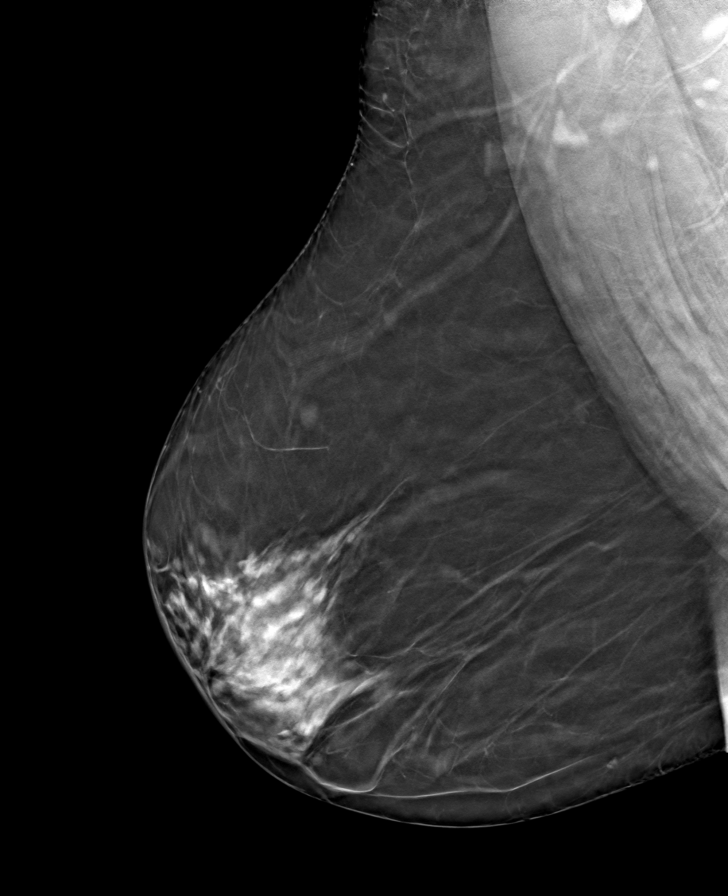

[L MLO tomo · tomo slice 39/76.0]
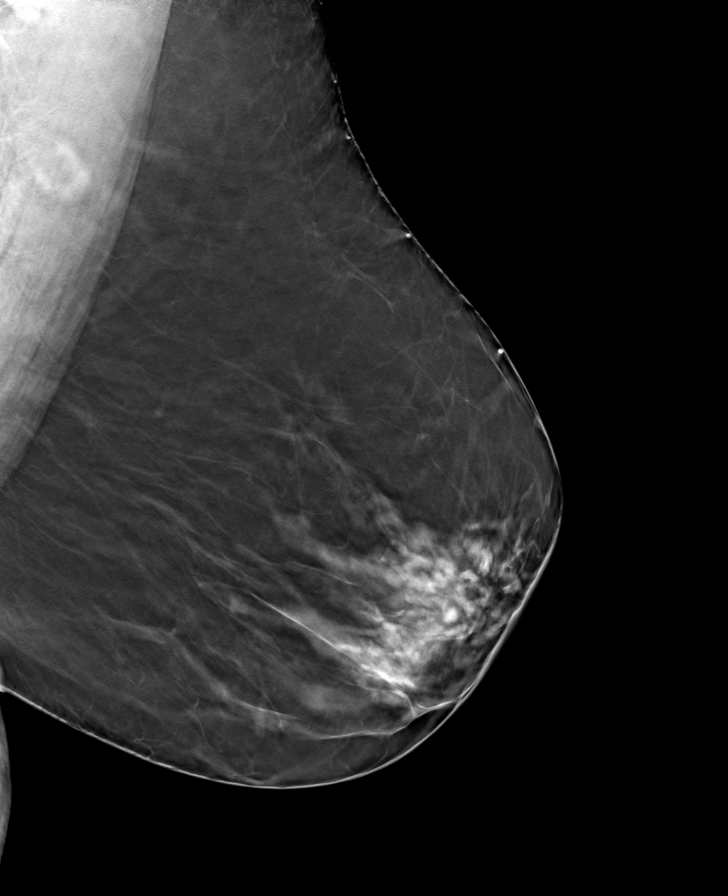

[R CC tomo · tomo slice 35/68.0]
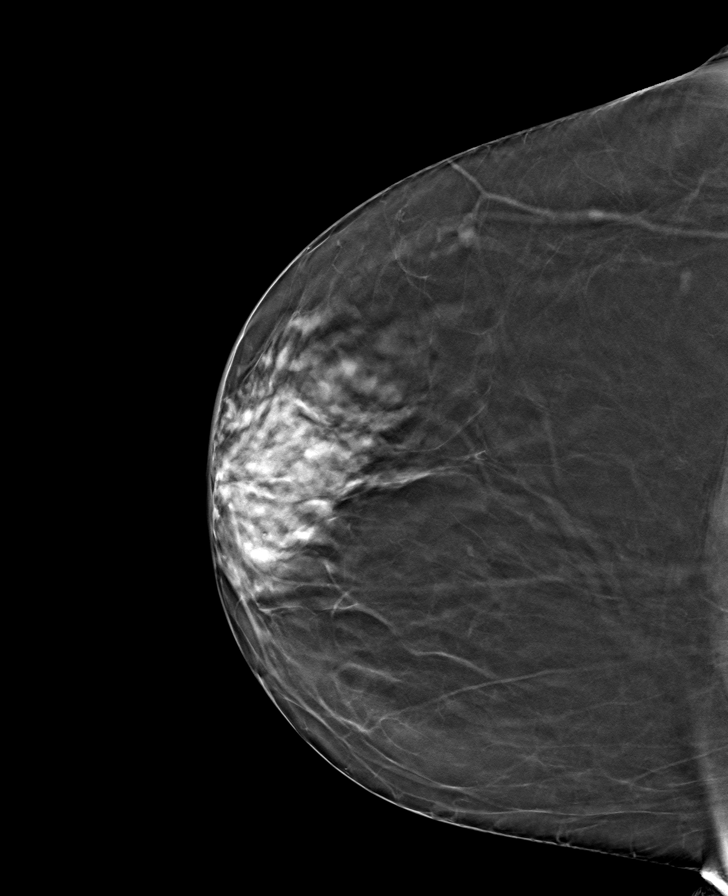

[8 of 24 positions shown; findings below may reference images not displayed]

ACR Breast Density Category b: There are scattered areas of
fibroglandular density.
FINDINGS: There are no findings suspicious for malignancy.
IMPRESSION: No mammographic evidence of malignancy. A result letter of this
screening mammogram will be mailed directly to the patient.

RECOMMENDATION:
Screening mammogram in one year. (Code:51-O-LD2)

BI-RADS CATEGORY  1: Negative.

## 2023-11-22 ENCOUNTER — Emergency Department
Admission: EM | Admit: 2023-11-22 | Discharge: 2023-11-22 | Disposition: A | Discharge status: Home / Self Care | Payer: PRIVATE HEALTH INSURANCE | Attending: Emergency Medicine | Admitting: Emergency Medicine

## 2023-11-22 ENCOUNTER — Emergency Department: Payer: PRIVATE HEALTH INSURANCE

## 2023-11-22 ENCOUNTER — Other Ambulatory Visit: Payer: Self-pay

## 2023-11-22 DIAGNOSIS — R059 Cough, unspecified: Secondary | ICD-10-CM | POA: Diagnosis present

## 2023-11-22 DIAGNOSIS — J45909 Unspecified asthma, uncomplicated: Secondary | ICD-10-CM | POA: Insufficient documentation

## 2023-11-22 DIAGNOSIS — J40 Bronchitis, not specified as acute or chronic: Secondary | ICD-10-CM

## 2023-11-22 LAB — BASIC METABOLIC PANEL WITH GFR
Anion gap: 11 (ref 5–15)
BUN: 16 mg/dL (ref 6–20)
CO2: 24 mmol/L (ref 22–32)
Calcium: 9.2 mg/dL (ref 8.9–10.3)
Chloride: 105 mmol/L (ref 98–111)
Creatinine, Ser: 0.86 mg/dL (ref 0.44–1.00)
GFR, Estimated: >60 mL/min (ref >60)
Glucose, Bld: 136 mg/dL — ABNORMAL HIGH (ref 70–99)
Potassium: 3.7 mmol/L (ref 3.5–5.1)
Sodium: 140 mmol/L (ref 135–145)

## 2023-11-22 LAB — CBC
HCT: 35.5 % — ABNORMAL LOW (ref 36.0–46.0)
Hemoglobin: 12 g/dL (ref 12.0–15.0)
MCH: 31.3 pg (ref 26.0–34.0)
MCHC: 33.8 g/dL (ref 30.0–36.0)
MCV: 92.4 fL (ref 80.0–100.0)
Platelets: 190 K/uL (ref 150–400)
RBC: 3.84 MIL/uL — ABNORMAL LOW (ref 3.87–5.11)
RDW: 14.3 % (ref 11.5–15.5)
WBC: 8 K/uL (ref 4.0–10.5)
nRBC: 0 % (ref 0.0–0.2)

## 2023-11-22 LAB — TROPONIN I (HIGH SENSITIVITY): Troponin I (High Sensitivity): 3 ng/L (ref <18)

## 2023-11-22 MED ORDER — PREDNISONE 10 MG (21) PO TBPK: ORAL_TABLET | ORAL | 0 refills | Status: DC

## 2023-11-22 MED ORDER — BENZONATATE 100 MG PO CAPS: 100.0000 mg | ORAL_CAPSULE | Freq: Three times a day (TID) | ORAL | 0 refills | Status: AC | PRN

## 2023-11-22 MED ORDER — PREDNISONE 10 MG (21) PO TBPK: ORAL_TABLET | ORAL | 0 refills | Status: AC

## 2023-11-22 MED ORDER — BENZONATATE 100 MG PO CAPS: 100.0000 mg | ORAL_CAPSULE | Freq: Three times a day (TID) | ORAL | 0 refills | Status: DC | PRN

## 2023-11-22 NOTE — ED Triage Notes (Signed)
 Pt c/o CP x3-4 days due to cough. Pt c/o HSOB and cough, denies dizziness N/V. Pt AOX4, NAD noted.

## 2023-11-22 NOTE — ED Provider Notes (Signed)
 [facility] Provider Note    Event Date/Time   First MD Initiated Contact with Patient 11/22/23 1505     (approximate)   History   Cough   HPI  [name] is a 58 y.o. female who presents to the emergency department today because of concerns for cough.  Has been present for about 3 days.  About a week ago she saw her doctor because of concerns for sinusitis.  She now thinks that is draining down into her lungs.  She does have some discomfort with the cough.  It has been productive of the drainage.  Patient denies any fevers or chills.  Patient does have history of asthma and has been using her inhaler without any significant relief.    Physical Exam   Triage Vital Signs: ED Triage Vitals  Encounter Vitals Group     BP 11/22/23 1304 120/89     Girls Systolic BP Percentile --      Girls Diastolic BP Percentile --      Boys Systolic BP Percentile --      Boys Diastolic BP Percentile --      Pulse Rate 11/22/23 1304 74     Resp 11/22/23 1304 18     Temp 11/22/23 1304 97.7 F (36.5 C)     Temp Source 11/22/23 1304 Oral     SpO2 11/22/23 1304 100 %     Weight 11/22/23 1303 225 lb (102.1 kg)     Height 11/22/23 1303 5' 5 (1.651 m)     Head Circumference --      Peak Flow --      Pain Score 11/22/23 1303 4     Pain Loc --      Pain Education --      Exclude from Growth Chart --     Most recent vital signs: Vitals:   11/22/23 1448 11/22/23 1449  BP:  137/66  Pulse:  85  Resp:  19  Temp:    SpO2: 100% 100%   General: Awake, alert, oriented. CV:  Good peripheral perfusion. Regular rate and rhythm. Resp:  Normal effort.  Abd:  No distention.   ED Results / Procedures / Treatments   Labs (all labs ordered are listed, but only abnormal results are displayed) Labs Reviewed  BASIC METABOLIC PANEL WITH GFR - Abnormal; Notable for the following components:      Result Value   Glucose, Bld 136 (*)    All other components within normal  limits  CBC - Abnormal; Notable for the following components:   RBC 3.84 (*)    HCT 35.5 (*)    All other components within normal limits  TROPONIN I (HIGH SENSITIVITY)  TROPONIN I (HIGH SENSITIVITY)     EKG  I, Guadalupe Eagles, attending physician, personally viewed and interpreted this EKG  EKG Time: 1305 Rate: 82 Rhythm: normal sinus rhythm Axis: normal Intervals: qtc 432 QRS: narrow, q waves II, III, aVF, v5, v6 ST changes: no st elevation Impression: abnormal ekg    RADIOLOGY I independently interpreted and visualized the CXR. My interpretation: No pneumonia Radiology interpretation:  IMPRESSION:  No active cardiopulmonary disease.      PROCEDURES:  Critical Care performed: No    MEDICATIONS ORDERED IN ED: Medications - No data to display   IMPRESSION / MDM / ASSESSMENT AND PLAN / ED COURSE  I reviewed the triage vital signs and the nursing notes.  Differential diagnosis includes, but is not limited to, bronchitis, asthma exacerbation, pneumonia, viral illness  Patient's presentation is most consistent with acute presentation with potential threat to life or bodily function.   Patient presented to the emergency department today because of concerns for cough for 3 days in the setting of sinusitis.  She also has history of asthma.  Patient's blood work without concerning leukocytosis.  Patient afebrile.  Chest x-ray without pneumonia.  Lungs without any focal auscultatory findings.  At this time I have low concern for bacterial infection.  Do think patient would benefit from a course of steroids.  Additionally will give prescription for cough medicine.  Did discuss antibiotics with the patient.  At this time do not think antibiotics are warranted.  Did discuss with patient that typically antibiotics are started after 2 weeks of sinusitis.  Encourage patient to follow-up with her primary care if she continued to have symptoms.      FINAL CLINICAL IMPRESSION(S) / ED DIAGNOSES   Final diagnoses:  Bronchitis     Note:  This document was prepared using Dragon voice recognition software and may include unintentional dictation errors.    Floy Roberts, MD 11/22/23 [phone]

## 2023-12-02 ENCOUNTER — Emergency Department: Payer: PRIVATE HEALTH INSURANCE

## 2023-12-02 ENCOUNTER — Emergency Department
Admission: EM | Admit: 2023-12-02 | Discharge: 2023-12-02 | Disposition: A | Discharge status: Home / Self Care | Payer: PRIVATE HEALTH INSURANCE | Attending: Emergency Medicine | Admitting: Emergency Medicine

## 2023-12-02 ENCOUNTER — Other Ambulatory Visit: Payer: Self-pay

## 2023-12-02 DIAGNOSIS — K219 Gastro-esophageal reflux disease without esophagitis: Secondary | ICD-10-CM | POA: Insufficient documentation

## 2023-12-02 DIAGNOSIS — R059 Cough, unspecified: Secondary | ICD-10-CM | POA: Diagnosis present

## 2023-12-02 DIAGNOSIS — J45909 Unspecified asthma, uncomplicated: Secondary | ICD-10-CM | POA: Diagnosis not present

## 2023-12-02 DIAGNOSIS — R079 Chest pain, unspecified: Secondary | ICD-10-CM

## 2023-12-02 LAB — CBC
HCT: 39.4 % (ref 36.0–46.0)
Hemoglobin: 13.3 g/dL (ref 12.0–15.0)
MCH: 31.1 pg (ref 26.0–34.0)
MCHC: 33.8 g/dL (ref 30.0–36.0)
MCV: 92.3 fL (ref 80.0–100.0)
Platelets: 223 K/uL (ref 150–400)
RBC: 4.27 MIL/uL (ref 3.87–5.11)
RDW: 14.5 % (ref 11.5–15.5)
WBC: 7 K/uL (ref 4.0–10.5)
nRBC: 0 % (ref 0.0–0.2)

## 2023-12-02 LAB — BASIC METABOLIC PANEL WITH GFR
Anion gap: 14 (ref 5–15)
BUN: 22 mg/dL — ABNORMAL HIGH (ref 6–20)
CO2: 23 mmol/L (ref 22–32)
Calcium: 9.6 mg/dL (ref 8.9–10.3)
Chloride: 101 mmol/L (ref 98–111)
Creatinine, Ser: 0.98 mg/dL (ref 0.44–1.00)
GFR, Estimated: >60 mL/min (ref >60)
Glucose, Bld: 133 mg/dL — ABNORMAL HIGH (ref 70–99)
Potassium: 4.1 mmol/L (ref 3.5–5.1)
Sodium: 138 mmol/L (ref 135–145)

## 2023-12-02 LAB — TROPONIN I (HIGH SENSITIVITY)
Troponin I (High Sensitivity): 3 ng/L (ref <18)
Troponin I (High Sensitivity): 3 ng/L (ref <18)

## 2023-12-02 LAB — RESP PANEL BY RT-PCR (RSV, FLU A&B, COVID)  RVPGX2
Influenza A by PCR: NEGATIVE
Influenza B by PCR: NEGATIVE
Resp Syncytial Virus by PCR: NEGATIVE
SARS Coronavirus 2 by RT PCR: NEGATIVE

## 2023-12-02 MED ORDER — IOHEXOL 350 MG/ML SOLN
75.0000 mL | Freq: Once | INTRAVENOUS | Status: AC | PRN
  Administered 2023-12-02: 75 mL via INTRAVENOUS

## 2023-12-02 MED ORDER — KETOROLAC TROMETHAMINE 15 MG/ML IJ SOLN
15.0000 mg | Freq: Once | INTRAMUSCULAR | Status: AC
  Administered 2023-12-02: 15 mg via INTRAVENOUS
  Filled 2023-12-02: qty 1

## 2023-12-02 MED ORDER — PANTOPRAZOLE SODIUM 40 MG IV SOLR
40.0000 mg | Freq: Once | INTRAVENOUS | Status: AC
  Administered 2023-12-02: 40 mg via INTRAVENOUS
  Filled 2023-12-02: qty 10

## 2023-12-02 NOTE — ED Triage Notes (Signed)
 Pt reports L shoulder blade pain and shortness of breath that began this morning. Pt reports recent URI. Pain is worse with inspiration.

## 2023-12-02 NOTE — ED Provider Notes (Signed)
 [facility] Provider Note    Event Date/Time   First MD Initiated Contact with Patient 12/02/23 [phone]     (approximate)   History   Shortness of Breath  HPI  [name] is a 58 y.o. female past medical history significant for asthma who presents to the emergency department with chest pain and shortness of breath.  Patient states that she started having left sharp stabbing pain to her left shoulder blade that started this morning.  Complaining of shortness of breath and feels like it is difficult to catch a deep breath.  States that yesterday she felt lightheaded when she went to stand up while she was at work.  States that she has not been feeling well for the past couple of weeks.  Was evaluated in the emergency department for cough and diagnosed with a bronchitis and was started on a Z-Pak.  States that she was improving until this morning when she was having sharp pain.  Denies fever or chills.  Does state that she was breaking out in cold sweats.  Denies nausea, vomiting.  No abdominal pain.  No swelling in her legs.  No history of PE or DVT.  Denies tobacco use.  Not on anticoagulation.     Physical Exam   Triage Vital Signs: ED Triage Vitals  Encounter Vitals Group     BP 12/02/23 0636 (!) 143/79     Girls Systolic BP Percentile --      Girls Diastolic BP Percentile --      Boys Systolic BP Percentile --      Boys Diastolic BP Percentile --      Pulse Rate 12/02/23 0636 96     Resp 12/02/23 0636 (!) 22     Temp 12/02/23 0636 98.4 F (36.9 C)     Temp src --      SpO2 12/02/23 0636 100 %     Weight 12/02/23 0635 224 lb (101.6 kg)     Height 12/02/23 0635 5' 5 (1.651 m)     Head Circumference --      Peak Flow --      Pain Score 12/02/23 0635 10     Pain Loc --      Pain Education --      Exclude from Growth Chart --     Most recent vital signs: Vitals:   12/02/23 0645 12/02/23 0730  BP:  (!) 140/85  Pulse:  92  Resp:  14  Temp:     SpO2: 100% 100%    Physical Exam Constitutional:      Appearance: She is well-developed.  HENT:     Head: Atraumatic.  Eyes:     Extraocular Movements: Extraocular movements intact.     Conjunctiva/sclera: Conjunctivae normal.     Pupils: Pupils are equal, round, and reactive to light.  Cardiovascular:     Rate and Rhythm: Regular rhythm.  Pulmonary:     Effort: No respiratory distress.     Breath sounds: No wheezing, rhonchi or rales.  Abdominal:     General: There is no distension.  Musculoskeletal:        General: Normal range of motion.     Cervical back: Normal range of motion.     Right lower leg: No edema.     Left lower leg: No edema.     Comments: No unilateral leg swelling  Skin:    General: Skin is warm.     Capillary Refill:  Capillary refill takes less than 2 seconds.  Neurological:     General: No focal deficit present.     Mental Status: She is alert. Mental status is at baseline.     IMPRESSION / MDM / ASSESSMENT AND PLAN / ED COURSE  I reviewed the triage vital signs and the nursing notes.  Differential diagnosis including pneumonia, pleurisy, pericarditis, pulmonary embolism, asthma exacerbation, viral illness including COVID/influenza.  Abdominal exam is nontender to palpation, have a low suspicion for referred pain from the abdomen.  No tearing pain and pulses are symmetric and equal, have a low suspicion for dissection.  Given ketorolac  for pain control.  Plan for CTA to further evaluate for pulmonary embolism  EKG  I, Clotilda Punter, the attending physician, personally viewed and interpreted this ECG.  Normal sinus rhythm.  Normal intervals.  No chamber enlargement.  No significant ST elevation or depression.  No findings of acute ischemia or dysrhythmia.  No significant change when compared to prior EKG  No tachycardic or bradycardic dysrhythmias while on cardiac telemetry.  RADIOLOGY I independently reviewed imaging, my interpretation of  imaging: Chest x-ray no signs of pneumonia read as no acute findings  CTA PE -no signs of pneumonia or pulmonary embolism noted.  Read as no pulmonary embolism, pneumonia or pleural effusion.  Mild circumferential wall thickening to the distal esophagus likely GERD   LABS (all labs ordered are listed, but only abnormal results are displayed) Labs interpreted as -    Labs Reviewed  BASIC METABOLIC PANEL WITH GFR - Abnormal; Notable for the following components:      Result Value   Glucose, Bld 133 (*)    BUN 22 (*)    All other components within normal limits  RESP PANEL BY RT-PCR (RSV, FLU A&B, COVID)  RVPGX2  CBC  TROPONIN I (HIGH SENSITIVITY)  TROPONIN I (HIGH SENSITIVITY)     MDM  No significant leukocytosis or anemia.  Creatinine is at her baseline.  No significant electrolyte abnormalities.  Initial troponin is negative at 3.  Serial troponins negative, heart score of 3, chest pain is atypical and have low suspicion for ACS.  Reevaluation after ketorolac  had significant improvement of her chest pain is feeling much better.  COVID and influenza testing are negative.  CTA PE study without findings concerning for pulmonary embolism or pneumonia.  No pleural effusions.  Did note findings concerning for GERD.  Patient is already on pantoprazole .  Given IV Protonix  in the emergency department.  Abdominal exam continues to be benign.  Patient has never had an endoscopy in the past that she can recall.  No positional change, no findings on EKG and no rub on exam, have low suspicion for acute pericarditis.  Discussed symptomatic treatment.  Discussed increasing her pantoprazole  as needed and close follow-up with her primary care physician for reevaluation and improvement of her pain.  Discussed that she likely needs an endoscopy with gastroenterology if she has not had 1 and is having ongoing symptoms.  Discussed return precautions for any worsening symptoms or worsening pain.  No questions  at time of discharge.     PROCEDURES:  Critical Care performed: No  Procedures  Patient's presentation is most consistent with acute presentation with potential threat to life or bodily function.   MEDICATIONS ORDERED IN ED: Medications  pantoprazole  (PROTONIX ) injection 40 mg (has no administration in time range)  ketorolac  (TORADOL ) 15 MG/ML injection 15 mg (15 mg Intravenous Given 12/02/23 0737)  iohexol  (OMNIPAQUE ) 350  MG/ML injection 75 mL (75 mLs Intravenous Contrast Given 12/02/23 0752)    FINAL CLINICAL IMPRESSION(S) / ED DIAGNOSES   Final diagnoses:  Chest pain, unspecified type  Gastroesophageal reflux disease, unspecified whether esophagitis present     Rx / DC Orders   ED Discharge Orders     None        Note:  This document was prepared using Dragon voice recognition software and may include unintentional dictation errors.   Suzanne Kirsch, MD 12/02/23 [phone]

## 2023-12-02 NOTE — Discharge Instructions (Addendum)
 You are seen in the emergency department for chest pain.  You had a CT scan with contrast dye that did not show any findings of a pulmonary embolism or blood clot in your lungs.  There was no findings of pneumonia.  You did have some inflammation to your esophagus.  You are given an NSAID in the emergency department with improvement of your pain.  You are also given an acid reducing medication.  Continue to take your pantoprazole  as prescribed, if only taking 20 mg twice daily could increase to 40 mg.  Eat small frequent meals and do not lie down immediately after eating.  Call and follow-up closely with your primary care physician to schedule an appointment for reevaluation.  You may need to be evaluated by gastroenterology for possible endoscopy.  Pain control:  Acetaminophen  (tylenol ) - You can take 2 extra strength tablets (1000 mg) every 6 hours as needed for pain/fever.  If you are still having significant pain after Tylenol  you can take 400 to 600 mg of Motrin/NSAID, make sure that you take this with food and do not lie down immediately.  Return to the emergency department for any worsening symptoms.

## 2023-12-02 NOTE — ED Notes (Signed)
Assisted to restroom and back
